# Patient Record
Sex: Male | Born: 1935 | Race: White | Hispanic: No | Marital: Married | Smoking: Never smoker
Health system: Southern US, Community
[De-identification: ages and names within clinical notes are randomized; demographics above are authoritative.]

## PROBLEM LIST (undated history)

## (undated) DIAGNOSIS — M1990 Unspecified osteoarthritis, unspecified site: Secondary | ICD-10-CM

## (undated) DIAGNOSIS — N400 Enlarged prostate without lower urinary tract symptoms: Secondary | ICD-10-CM

## (undated) DIAGNOSIS — D751 Secondary polycythemia: Secondary | ICD-10-CM

## (undated) DIAGNOSIS — R51 Headache: Secondary | ICD-10-CM

## (undated) DIAGNOSIS — K635 Polyp of colon: Secondary | ICD-10-CM

## (undated) DIAGNOSIS — I7100 Dissection of unspecified site of aorta: Secondary | ICD-10-CM

## (undated) DIAGNOSIS — R519 Headache, unspecified: Secondary | ICD-10-CM

## (undated) DIAGNOSIS — I10 Essential (primary) hypertension: Secondary | ICD-10-CM

## (undated) DIAGNOSIS — I442 Atrioventricular block, complete: Secondary | ICD-10-CM

## (undated) DIAGNOSIS — N200 Calculus of kidney: Secondary | ICD-10-CM

## (undated) DIAGNOSIS — C801 Malignant (primary) neoplasm, unspecified: Secondary | ICD-10-CM

## (undated) HISTORY — DX: Headache: R51

## (undated) HISTORY — DX: Hemochromatosis, unspecified: E83.119

## (undated) HISTORY — DX: Benign prostatic hyperplasia without lower urinary tract symptoms: N40.0

## (undated) HISTORY — DX: Headache, unspecified: R51.9

## (undated) HISTORY — DX: Malignant (primary) neoplasm, unspecified: C80.1

## (undated) HISTORY — DX: Atrioventricular block, complete: I44.2

## (undated) HISTORY — DX: Secondary polycythemia: D75.1

## (undated) HISTORY — DX: Polyp of colon: K63.5

## (undated) HISTORY — PX: COLONOSCOPY W/ POLYPECTOMY: SHX1380

## (undated) HISTORY — DX: Calculus of kidney: N20.0

## (undated) HISTORY — DX: Essential (primary) hypertension: I10

## (undated) HISTORY — DX: Dissection of unspecified site of aorta: I71.00

---

## 1998-02-28 ENCOUNTER — Emergency Department (HOSPITAL_COMMUNITY): Admission: EM | Admit: 1998-02-28 | Discharge: 1998-02-28 | Payer: Self-pay | Admitting: Emergency Medicine

## 1998-02-28 ENCOUNTER — Encounter: Payer: Self-pay | Admitting: Emergency Medicine

## 2002-03-03 ENCOUNTER — Encounter: Payer: Self-pay | Admitting: General Surgery

## 2002-03-09 ENCOUNTER — Encounter (INDEPENDENT_AMBULATORY_CARE_PROVIDER_SITE_OTHER): Payer: Self-pay | Admitting: Specialist

## 2002-03-09 ENCOUNTER — Inpatient Hospital Stay (HOSPITAL_COMMUNITY): Admission: RE | Admit: 2002-03-09 | Discharge: 2002-03-17 | Payer: Self-pay | Admitting: General Surgery

## 2002-03-15 ENCOUNTER — Encounter: Payer: Self-pay | Admitting: General Surgery

## 2002-04-06 ENCOUNTER — Encounter: Payer: Self-pay | Admitting: *Deleted

## 2002-04-06 ENCOUNTER — Ambulatory Visit (HOSPITAL_COMMUNITY): Admission: RE | Admit: 2002-04-06 | Discharge: 2002-04-06 | Payer: Self-pay | Admitting: *Deleted

## 2002-09-19 ENCOUNTER — Encounter: Payer: Self-pay | Admitting: *Deleted

## 2002-09-19 ENCOUNTER — Ambulatory Visit (HOSPITAL_COMMUNITY): Admission: RE | Admit: 2002-09-19 | Discharge: 2002-09-19 | Payer: Self-pay | Admitting: *Deleted

## 2003-05-05 HISTORY — PX: COLON SURGERY: SHX602

## 2003-05-05 HISTORY — PX: HERNIA REPAIR: SHX51

## 2003-05-19 ENCOUNTER — Emergency Department (HOSPITAL_COMMUNITY): Admission: EM | Admit: 2003-05-19 | Discharge: 2003-05-19 | Payer: Self-pay | Admitting: Emergency Medicine

## 2003-06-01 ENCOUNTER — Ambulatory Visit (HOSPITAL_COMMUNITY): Admission: RE | Admit: 2003-06-01 | Discharge: 2003-06-01 | Payer: Self-pay | Admitting: General Surgery

## 2003-06-19 ENCOUNTER — Ambulatory Visit (HOSPITAL_COMMUNITY): Admission: RE | Admit: 2003-06-19 | Discharge: 2003-06-19 | Payer: Self-pay | Admitting: Orthopedic Surgery

## 2003-07-10 ENCOUNTER — Ambulatory Visit (HOSPITAL_COMMUNITY): Admission: RE | Admit: 2003-07-10 | Discharge: 2003-07-10 | Payer: Self-pay | Admitting: Neurosurgery

## 2003-08-16 ENCOUNTER — Encounter: Admission: RE | Admit: 2003-08-16 | Discharge: 2003-08-16 | Payer: Self-pay | Admitting: Orthopedic Surgery

## 2003-08-16 ENCOUNTER — Ambulatory Visit (HOSPITAL_COMMUNITY): Admission: RE | Admit: 2003-08-16 | Discharge: 2003-08-16 | Payer: Self-pay | Admitting: Orthopedic Surgery

## 2003-08-16 ENCOUNTER — Ambulatory Visit (HOSPITAL_BASED_OUTPATIENT_CLINIC_OR_DEPARTMENT_OTHER): Admission: RE | Admit: 2003-08-16 | Discharge: 2003-08-16 | Payer: Self-pay | Admitting: Orthopedic Surgery

## 2004-06-06 ENCOUNTER — Ambulatory Visit: Payer: Self-pay | Admitting: Oncology

## 2004-06-13 ENCOUNTER — Ambulatory Visit: Payer: Self-pay | Admitting: Internal Medicine

## 2004-06-16 ENCOUNTER — Ambulatory Visit: Payer: Self-pay | Admitting: Internal Medicine

## 2004-06-18 ENCOUNTER — Ambulatory Visit: Payer: Self-pay | Admitting: Internal Medicine

## 2004-06-18 ENCOUNTER — Ambulatory Visit (HOSPITAL_COMMUNITY): Admission: RE | Admit: 2004-06-18 | Discharge: 2004-06-18 | Payer: Self-pay | Admitting: Oncology

## 2004-06-25 ENCOUNTER — Ambulatory Visit: Payer: Self-pay | Admitting: Internal Medicine

## 2004-08-20 ENCOUNTER — Ambulatory Visit: Payer: Self-pay | Admitting: Internal Medicine

## 2004-09-17 ENCOUNTER — Ambulatory Visit: Payer: Self-pay | Admitting: Internal Medicine

## 2004-10-18 ENCOUNTER — Emergency Department (HOSPITAL_COMMUNITY): Admission: EM | Admit: 2004-10-18 | Discharge: 2004-10-18 | Payer: Self-pay | Admitting: Emergency Medicine

## 2005-01-20 ENCOUNTER — Ambulatory Visit: Payer: Self-pay | Admitting: Internal Medicine

## 2005-01-28 ENCOUNTER — Ambulatory Visit (HOSPITAL_COMMUNITY): Admission: RE | Admit: 2005-01-28 | Discharge: 2005-01-28 | Payer: Self-pay | Admitting: Internal Medicine

## 2005-02-06 ENCOUNTER — Ambulatory Visit: Payer: Self-pay | Admitting: Internal Medicine

## 2005-03-12 ENCOUNTER — Ambulatory Visit: Payer: Self-pay | Admitting: Internal Medicine

## 2005-04-21 ENCOUNTER — Ambulatory Visit: Payer: Self-pay | Admitting: Internal Medicine

## 2005-06-05 ENCOUNTER — Ambulatory Visit: Payer: Self-pay | Admitting: Oncology

## 2005-06-11 ENCOUNTER — Ambulatory Visit (HOSPITAL_COMMUNITY): Admission: RE | Admit: 2005-06-11 | Discharge: 2005-06-11 | Payer: Self-pay | Admitting: Oncology

## 2005-06-11 ENCOUNTER — Ambulatory Visit: Payer: Self-pay | Admitting: Internal Medicine

## 2005-08-25 ENCOUNTER — Ambulatory Visit (HOSPITAL_COMMUNITY): Admission: RE | Admit: 2005-08-25 | Discharge: 2005-08-26 | Payer: Self-pay | Admitting: General Surgery

## 2005-09-06 ENCOUNTER — Ambulatory Visit: Payer: Self-pay | Admitting: Internal Medicine

## 2005-09-06 ENCOUNTER — Inpatient Hospital Stay (HOSPITAL_COMMUNITY): Admission: EM | Admit: 2005-09-06 | Discharge: 2005-09-08 | Payer: Self-pay | Admitting: Emergency Medicine

## 2005-09-16 ENCOUNTER — Ambulatory Visit: Payer: Self-pay | Admitting: Internal Medicine

## 2006-03-18 ENCOUNTER — Ambulatory Visit: Payer: Self-pay | Admitting: Internal Medicine

## 2006-05-27 ENCOUNTER — Ambulatory Visit: Payer: Self-pay | Admitting: Oncology

## 2006-06-01 LAB — COMPREHENSIVE METABOLIC PANEL
ALT: 18 U/L (ref 0–53)
AST: 20 U/L (ref 0–37)
Albumin: 4.4 g/dL (ref 3.5–5.2)
Alkaline Phosphatase: 84 U/L (ref 39–117)
Chloride: 103 mEq/L (ref 96–112)
Potassium: 5.1 mEq/L (ref 3.5–5.3)
Sodium: 142 mEq/L (ref 135–145)
Total Protein: 6.8 g/dL (ref 6.0–8.3)

## 2006-06-01 LAB — CBC WITH DIFFERENTIAL/PLATELET
EOS%: 3.1 % (ref 0.0–7.0)
MCH: 32.9 pg (ref 28.0–33.4)
MCV: 94.3 fL (ref 81.6–98.0)
MONO%: 10.1 % (ref 0.0–13.0)
NEUT#: 3.7 10*3/uL (ref 1.5–6.5)
RBC: 5.49 10*6/uL (ref 4.20–5.71)
RDW: 12.8 % (ref 11.2–14.6)
lymph#: 1.7 10*3/uL (ref 0.9–3.3)

## 2006-06-04 ENCOUNTER — Ambulatory Visit: Payer: Self-pay | Admitting: Oncology

## 2006-06-04 ENCOUNTER — Ambulatory Visit: Payer: Self-pay | Admitting: Internal Medicine

## 2006-06-07 ENCOUNTER — Ambulatory Visit: Payer: Self-pay | Admitting: Internal Medicine

## 2006-06-07 ENCOUNTER — Encounter (INDEPENDENT_AMBULATORY_CARE_PROVIDER_SITE_OTHER): Payer: Self-pay | Admitting: Specialist

## 2006-06-09 LAB — IRON AND TIBC
%SAT: 31 % (ref 20–55)
Iron: 99 ug/dL (ref 42–165)
TIBC: 323 ug/dL (ref 215–435)
UIBC: 224 ug/dL

## 2006-06-09 LAB — VITAMIN B12: Vitamin B-12: 309 pg/mL (ref 211–911)

## 2006-06-09 LAB — FERRITIN: Ferritin: 107 ng/mL (ref 22–322)

## 2006-06-22 ENCOUNTER — Ambulatory Visit (HOSPITAL_COMMUNITY): Admission: RE | Admit: 2006-06-22 | Discharge: 2006-06-22 | Payer: Self-pay | Admitting: Oncology

## 2006-07-05 LAB — ERYTHROPOIETIN: Erythropoietin: 11.6 m[IU]/mL (ref 2.6–34.0)

## 2006-07-05 LAB — FOLATE: Folate: >20 ng/mL

## 2006-07-05 LAB — CBC WITH DIFFERENTIAL (CANCER CENTER ONLY)
BASO#: 0.2 10*3/uL (ref 0.0–0.2)
EOS%: 3.4 % (ref 0.0–7.0)
HGB: 18.7 g/dL — ABNORMAL HIGH (ref 13.0–17.1)
LYMPH#: 2.2 10*3/uL (ref 0.9–3.3)
MCHC: 34.7 g/dL (ref 32.0–35.9)
NEUT#: 3.9 10*3/uL (ref 1.5–6.5)
Platelets: 130 10*3/uL — ABNORMAL LOW (ref 145–400)
RBC: 5.61 10*6/uL (ref 4.20–5.70)

## 2006-07-05 LAB — IRON AND TIBC
%SAT: 58 % — ABNORMAL HIGH (ref 20–55)
TIBC: 342 ug/dL (ref 215–435)

## 2006-07-05 LAB — VITAMIN B12: Vitamin B-12: 259 pg/mL (ref 211–911)

## 2006-07-05 LAB — FERRITIN: Ferritin: 92 ng/mL (ref 22–322)

## 2006-07-14 ENCOUNTER — Encounter (INDEPENDENT_AMBULATORY_CARE_PROVIDER_SITE_OTHER): Payer: Self-pay | Admitting: Interventional Radiology

## 2006-07-14 ENCOUNTER — Ambulatory Visit (HOSPITAL_COMMUNITY): Admission: RE | Admit: 2006-07-14 | Discharge: 2006-07-14 | Payer: Self-pay | Admitting: Oncology

## 2006-08-02 ENCOUNTER — Ambulatory Visit (HOSPITAL_BASED_OUTPATIENT_CLINIC_OR_DEPARTMENT_OTHER): Admission: RE | Admit: 2006-08-02 | Discharge: 2006-08-02 | Payer: Self-pay | Admitting: Internal Medicine

## 2006-08-10 ENCOUNTER — Ambulatory Visit: Payer: Self-pay | Admitting: Pulmonary Disease

## 2006-08-13 ENCOUNTER — Ambulatory Visit: Payer: Self-pay | Admitting: Oncology

## 2006-08-16 LAB — CBC WITH DIFFERENTIAL (CANCER CENTER ONLY)
BASO#: 0.2 10*3/uL (ref 0.0–0.2)
Eosinophils Absolute: 0.3 10*3/uL (ref 0.0–0.5)
HGB: 18.3 g/dL — ABNORMAL HIGH (ref 13.0–17.1)
LYMPH#: 2.2 10*3/uL (ref 0.9–3.3)
MCH: 32.9 pg (ref 28.0–33.4)
MONO#: 0.5 10*3/uL (ref 0.1–0.9)
MONO%: 6.7 % (ref 0.0–13.0)
NEUT#: 4.6 10*3/uL (ref 1.5–6.5)
Platelets: 143 10*3/uL — ABNORMAL LOW (ref 145–400)
RBC: 5.54 10*6/uL (ref 4.20–5.70)
WBC: 7.7 10*3/uL (ref 4.0–10.0)

## 2006-08-23 LAB — CBC WITH DIFFERENTIAL (CANCER CENTER ONLY)
BASO#: 0.1 10*3/uL (ref 0.0–0.2)
Eosinophils Absolute: 0.3 10*3/uL (ref 0.0–0.5)
HCT: 50.6 % — ABNORMAL HIGH (ref 38.7–49.9)
HGB: 17.4 g/dL — ABNORMAL HIGH (ref 13.0–17.1)
LYMPH%: 30.6 % (ref 14.0–48.0)
MCH: 33 pg (ref 28.0–33.4)
MCV: 96 fL (ref 82–98)
MONO#: 0.5 10*3/uL (ref 0.1–0.9)
MONO%: 6 % (ref 0.0–13.0)
NEUT%: 59 % (ref 40.0–80.0)
RBC: 5.27 10*6/uL (ref 4.20–5.70)
WBC: 7.7 10*3/uL (ref 4.0–10.0)

## 2006-08-31 LAB — CBC WITH DIFFERENTIAL (CANCER CENTER ONLY)
BASO%: 0.7 % (ref 0.0–2.0)
HCT: 47.2 % (ref 38.7–49.9)
LYMPH%: 31.3 % (ref 14.0–48.0)
MCH: 33.4 pg (ref 28.0–33.4)
MCV: 96 fL (ref 82–98)
MONO#: 0.5 10*3/uL (ref 0.1–0.9)
MONO%: 7.1 % (ref 0.0–13.0)
NEUT%: 57.6 % (ref 40.0–80.0)
Platelets: 166 10*3/uL (ref 145–400)
RDW: 12 % (ref 10.5–14.6)

## 2006-09-06 LAB — CBC WITH DIFFERENTIAL (CANCER CENTER ONLY)
BASO%: 0.7 % (ref 0.0–2.0)
EOS%: 4 % (ref 0.0–7.0)
HCT: 45.2 % (ref 38.7–49.9)
LYMPH#: 2.3 10*3/uL (ref 0.9–3.3)
LYMPH%: 31.2 % (ref 14.0–48.0)
MCHC: 34.3 g/dL (ref 32.0–35.9)
MCV: 96 fL (ref 82–98)
MONO#: 0.5 10*3/uL (ref 0.1–0.9)
NEUT%: 57.1 % (ref 40.0–80.0)
Platelets: 170 10*3/uL (ref 145–400)
RDW: 11.6 % (ref 10.5–14.6)
WBC: 7.3 10*3/uL (ref 4.0–10.0)

## 2006-09-20 LAB — CBC WITH DIFFERENTIAL (CANCER CENTER ONLY)
BASO#: 0.1 10*3/uL (ref 0.0–0.2)
EOS%: 4.3 % (ref 0.0–7.0)
Eosinophils Absolute: 0.3 10*3/uL (ref 0.0–0.5)
HCT: 45.7 % (ref 38.7–49.9)
HGB: 15.7 g/dL (ref 13.0–17.1)
LYMPH#: 2.2 10*3/uL (ref 0.9–3.3)
MCHC: 34.3 g/dL (ref 32.0–35.9)
MONO#: 0.7 10*3/uL (ref 0.1–0.9)
NEUT#: 4.3 10*3/uL (ref 1.5–6.5)
RBC: 4.86 10*6/uL (ref 4.20–5.70)
WBC: 7.7 10*3/uL (ref 4.0–10.0)

## 2006-09-20 LAB — FERRITIN: Ferritin: 19 ng/mL — ABNORMAL LOW (ref 22–322)

## 2006-10-04 ENCOUNTER — Ambulatory Visit: Payer: Self-pay | Admitting: Oncology

## 2006-10-05 LAB — CBC WITH DIFFERENTIAL (CANCER CENTER ONLY)
BASO#: 0 10*3/uL (ref 0.0–0.2)
Eosinophils Absolute: 0.3 10*3/uL (ref 0.0–0.5)
HCT: 43.6 % (ref 38.7–49.9)
HGB: 15 g/dL (ref 13.0–17.1)
MCH: 31.8 pg (ref 28.0–33.4)
MONO%: 9.2 % (ref 0.0–13.0)
NEUT#: 3.4 10*3/uL (ref 1.5–6.5)
NEUT%: 53.2 % (ref 40.0–80.0)
RBC: 4.72 10*6/uL (ref 4.20–5.70)

## 2006-11-09 LAB — CBC WITH DIFFERENTIAL (CANCER CENTER ONLY)
BASO#: 0.1 10*3/uL (ref 0.0–0.2)
Eosinophils Absolute: 0.3 10*3/uL (ref 0.0–0.5)
HGB: 15.8 g/dL (ref 13.0–17.1)
LYMPH%: 31.7 % (ref 14.0–48.0)
MCH: 30.4 pg (ref 28.0–33.4)
MCV: 91 fL (ref 82–98)
MONO%: 9.6 % (ref 0.0–13.0)
Platelets: 173 10*3/uL (ref 145–400)
RBC: 5.22 10*6/uL (ref 4.20–5.70)

## 2006-11-09 LAB — COMPREHENSIVE METABOLIC PANEL
ALT: 17 U/L (ref 0–53)
AST: 20 U/L (ref 0–37)
Alkaline Phosphatase: 77 U/L (ref 39–117)
Creatinine, Ser: 1.48 mg/dL (ref 0.40–1.50)
Total Bilirubin: 0.6 mg/dL (ref 0.3–1.2)

## 2006-11-09 LAB — IRON AND TIBC
%SAT: 18 % — ABNORMAL LOW (ref 20–55)
TIBC: 372 ug/dL (ref 215–435)

## 2006-11-30 DIAGNOSIS — K4090 Unilateral inguinal hernia, without obstruction or gangrene, not specified as recurrent: Secondary | ICD-10-CM | POA: Insufficient documentation

## 2006-11-30 DIAGNOSIS — N400 Enlarged prostate without lower urinary tract symptoms: Secondary | ICD-10-CM | POA: Insufficient documentation

## 2006-11-30 DIAGNOSIS — I10 Essential (primary) hypertension: Secondary | ICD-10-CM | POA: Insufficient documentation

## 2006-11-30 DIAGNOSIS — Z85038 Personal history of other malignant neoplasm of large intestine: Secondary | ICD-10-CM | POA: Insufficient documentation

## 2007-01-10 ENCOUNTER — Ambulatory Visit: Payer: Self-pay | Admitting: Oncology

## 2007-01-19 LAB — CBC WITH DIFFERENTIAL (CANCER CENTER ONLY)
BASO%: 0.8 % (ref 0.0–2.0)
EOS%: 4 % (ref 0.0–7.0)
LYMPH#: 1.9 10*3/uL (ref 0.9–3.3)
MCHC: 33.9 g/dL (ref 32.0–35.9)
MONO#: 0.5 10*3/uL (ref 0.1–0.9)
NEUT#: 3.9 10*3/uL (ref 1.5–6.5)
Platelets: 136 10*3/uL — ABNORMAL LOW (ref 145–400)
RDW: 14.8 % — ABNORMAL HIGH (ref 10.5–14.6)
WBC: 6.6 10*3/uL (ref 4.0–10.0)

## 2007-01-19 LAB — IRON AND TIBC
Iron: 99 ug/dL (ref 42–165)
TIBC: 369 ug/dL (ref 215–435)
UIBC: 270 ug/dL

## 2007-01-19 LAB — BASIC METABOLIC PANEL - CANCER CENTER ONLY
Chloride: 102 mEq/L (ref 98–108)
Potassium: 4.4 mEq/L (ref 3.3–4.7)

## 2007-01-19 LAB — FERRITIN: Ferritin: 13 ng/mL — ABNORMAL LOW (ref 22–322)

## 2007-01-24 LAB — JAK2 GENOTYPR

## 2007-03-15 ENCOUNTER — Ambulatory Visit: Payer: Self-pay | Admitting: Oncology

## 2007-03-16 LAB — BASIC METABOLIC PANEL - CANCER CENTER ONLY
BUN, Bld: 18 mg/dL (ref 7–22)
CO2: 27 meq/L (ref 18–33)
Calcium: 9.6 mg/dL (ref 8.0–10.3)
Chloride: 101 meq/L (ref 98–108)
Creat: 1.3 mg/dL — ABNORMAL HIGH (ref 0.6–1.2)
Glucose, Bld: 95 mg/dL (ref 73–118)
Potassium: 4.7 meq/L (ref 3.3–4.7)
Sodium: 139 meq/L (ref 128–145)

## 2007-03-16 LAB — CBC WITH DIFFERENTIAL (CANCER CENTER ONLY)
BASO%: 1.1 % (ref 0.0–2.0)
LYMPH#: 2.2 10*3/uL (ref 0.9–3.3)
LYMPH%: 28.7 % (ref 14.0–48.0)
MCV: 91 fL (ref 82–98)
MONO#: 0.7 10*3/uL (ref 0.1–0.9)
Platelets: 152 10*3/uL (ref 145–400)
RDW: 13.6 % (ref 10.5–14.6)
WBC: 7.6 10*3/uL (ref 4.0–10.0)

## 2007-03-16 LAB — IRON AND TIBC: %SAT: 46 % (ref 20–55)

## 2007-03-21 LAB — JAK2 GENOTYPR

## 2007-04-19 LAB — CBC WITH DIFFERENTIAL (CANCER CENTER ONLY)
BASO#: 0.1 10*3/uL (ref 0.0–0.2)
Eosinophils Absolute: 0.2 10*3/uL (ref 0.0–0.5)
HCT: 47.3 % (ref 38.7–49.9)
HGB: 16.2 g/dL (ref 13.0–17.1)
LYMPH#: 2 10*3/uL (ref 0.9–3.3)
MONO#: 0.7 10*3/uL (ref 0.1–0.9)
NEUT#: 3.4 10*3/uL (ref 1.5–6.5)
NEUT%: 53.7 % (ref 40.0–80.0)
RBC: 5.2 10*6/uL (ref 4.20–5.70)

## 2007-04-26 ENCOUNTER — Encounter: Payer: Self-pay | Admitting: Internal Medicine

## 2007-05-05 HISTORY — PX: HAND SURGERY: SHX662

## 2007-05-18 ENCOUNTER — Ambulatory Visit: Payer: Self-pay | Admitting: Oncology

## 2007-05-20 LAB — CBC WITH DIFFERENTIAL (CANCER CENTER ONLY)
BASO%: 1.7 % (ref 0.0–2.0)
LYMPH%: 29.1 % (ref 14.0–48.0)
MCH: 30.4 pg (ref 28.0–33.4)
MCHC: 33.8 g/dL (ref 32.0–35.9)
MCV: 90 fL (ref 82–98)
MONO%: 10.6 % (ref 0.0–13.0)
Platelets: 175 10*3/uL (ref 145–400)
RDW: 11.8 % (ref 10.5–14.6)

## 2007-05-20 LAB — FERRITIN: Ferritin: 11 ng/mL — ABNORMAL LOW (ref 22–322)

## 2007-05-24 ENCOUNTER — Encounter: Payer: Self-pay | Admitting: Internal Medicine

## 2007-08-15 ENCOUNTER — Ambulatory Visit: Payer: Self-pay | Admitting: Oncology

## 2007-08-17 LAB — CBC WITH DIFFERENTIAL (CANCER CENTER ONLY)
BASO#: 0.1 10*3/uL (ref 0.0–0.2)
Eosinophils Absolute: 0.2 10*3/uL (ref 0.0–0.5)
HCT: 46.5 % (ref 38.7–49.9)
HGB: 16 g/dL (ref 13.0–17.1)
LYMPH#: 1.6 10*3/uL (ref 0.9–3.3)
LYMPH%: 28 % (ref 14.0–48.0)
MCV: 86 fL (ref 82–98)
MONO#: 0.5 10*3/uL (ref 0.1–0.9)
NEUT%: 57.6 % (ref 40.0–80.0)
RDW: 14 % (ref 10.5–14.6)
WBC: 5.8 10*3/uL (ref 4.0–10.0)

## 2007-08-17 LAB — IRON AND TIBC
%SAT: 25 % (ref 20–55)
Iron: 84 ug/dL (ref 42–165)
TIBC: 330 ug/dL (ref 215–435)

## 2007-08-17 LAB — COMPREHENSIVE METABOLIC PANEL
ALT: 18 U/L (ref 0–53)
Alkaline Phosphatase: 83 U/L (ref 39–117)
CO2: 26 mEq/L (ref 19–32)
Creatinine, Ser: 1.23 mg/dL (ref 0.40–1.50)
Sodium: 140 mEq/L (ref 135–145)
Total Bilirubin: 0.6 mg/dL (ref 0.3–1.2)
Total Protein: 6.4 g/dL (ref 6.0–8.3)

## 2007-08-17 LAB — CEA: CEA: <0.5 ng/mL (ref 0.0–5.0)

## 2007-08-17 LAB — FERRITIN: Ferritin: 15 ng/mL — ABNORMAL LOW (ref 22–322)

## 2007-08-19 ENCOUNTER — Ambulatory Visit (HOSPITAL_COMMUNITY): Admission: RE | Admit: 2007-08-19 | Discharge: 2007-08-19 | Payer: Self-pay | Admitting: Oncology

## 2007-08-24 ENCOUNTER — Encounter: Payer: Self-pay | Admitting: Internal Medicine

## 2007-09-05 ENCOUNTER — Ambulatory Visit: Payer: Self-pay | Admitting: Internal Medicine

## 2007-09-05 DIAGNOSIS — D751 Secondary polycythemia: Secondary | ICD-10-CM | POA: Insufficient documentation

## 2007-09-21 ENCOUNTER — Ambulatory Visit (HOSPITAL_BASED_OUTPATIENT_CLINIC_OR_DEPARTMENT_OTHER): Admission: RE | Admit: 2007-09-21 | Discharge: 2007-09-21 | Payer: Self-pay | Admitting: Urology

## 2007-12-21 ENCOUNTER — Ambulatory Visit: Payer: Self-pay | Admitting: Oncology

## 2007-12-22 LAB — CBC WITH DIFFERENTIAL (CANCER CENTER ONLY)
BASO%: 0.8 % (ref 0.0–2.0)
Eosinophils Absolute: 0.3 10*3/uL (ref 0.0–0.5)
HCT: 46.6 % (ref 38.7–49.9)
LYMPH#: 2.1 10*3/uL (ref 0.9–3.3)
LYMPH%: 28.4 % (ref 14.0–48.0)
MCV: 90 fL (ref 82–98)
MONO#: 0.6 10*3/uL (ref 0.1–0.9)
Platelets: 148 10*3/uL (ref 145–400)
RBC: 5.16 10*6/uL (ref 4.20–5.70)
RDW: 12.9 % (ref 10.5–14.6)
WBC: 7.5 10*3/uL (ref 4.0–10.0)

## 2007-12-22 LAB — COMPREHENSIVE METABOLIC PANEL
ALT: 20 U/L (ref 0–53)
AST: 25 U/L (ref 0–37)
Albumin: 4 g/dL (ref 3.5–5.2)
Alkaline Phosphatase: 69 U/L (ref 39–117)
CO2: 29 mEq/L (ref 19–32)
Calcium: 9.6 mg/dL (ref 8.4–10.5)
Creatinine, Ser: 1.33 mg/dL (ref 0.40–1.50)
Sodium: 141 mEq/L (ref 135–145)
Total Bilirubin: 0.9 mg/dL (ref 0.3–1.2)
Total Protein: 6.2 g/dL (ref 6.0–8.3)

## 2007-12-22 LAB — FERRITIN: Ferritin: 29 ng/mL (ref 22–322)

## 2007-12-22 LAB — IRON AND TIBC
TIBC: 327 ug/dL (ref 215–435)
UIBC: 205 ug/dL

## 2007-12-27 ENCOUNTER — Encounter: Payer: Self-pay | Admitting: Internal Medicine

## 2008-02-20 ENCOUNTER — Ambulatory Visit: Payer: Self-pay | Admitting: Oncology

## 2008-02-24 LAB — IRON AND TIBC
%SAT: 42 % (ref 20–55)
Iron: 134 ug/dL (ref 42–165)
TIBC: 320 ug/dL (ref 215–435)

## 2008-02-24 LAB — CBC WITH DIFFERENTIAL (CANCER CENTER ONLY)
BASO%: 4.9 % — ABNORMAL HIGH (ref 0.0–2.0)
Eosinophils Absolute: 0.3 10*3/uL (ref 0.0–0.5)
MONO#: 0.5 10*3/uL (ref 0.1–0.9)
NEUT#: 3.3 10*3/uL (ref 1.5–6.5)
Platelets: 125 10*3/uL — ABNORMAL LOW (ref 145–400)
RBC: 5.22 10*6/uL (ref 4.20–5.70)
RDW: 12 % (ref 10.5–14.6)
WBC: 6.5 10*3/uL (ref 4.0–10.0)

## 2008-03-12 ENCOUNTER — Ambulatory Visit: Payer: Self-pay | Admitting: Internal Medicine

## 2008-05-23 ENCOUNTER — Ambulatory Visit: Payer: Self-pay | Admitting: Oncology

## 2008-05-25 LAB — IRON AND TIBC: TIBC: 323 ug/dL (ref 215–435)

## 2008-05-25 LAB — CBC WITH DIFFERENTIAL (CANCER CENTER ONLY)
BASO%: 2.7 % — ABNORMAL HIGH (ref 0.0–2.0)
EOS%: 3.8 % (ref 0.0–7.0)
LYMPH%: 20.2 % (ref 14.0–48.0)
MCH: 31.6 pg (ref 28.0–33.4)
MCHC: 33.7 g/dL (ref 32.0–35.9)
MONO%: 5.9 % (ref 0.0–13.0)
NEUT#: 4.3 10*3/uL (ref 1.5–6.5)
Platelets: 121 10*3/uL — ABNORMAL LOW (ref 145–400)
RBC: 5.68 10*6/uL (ref 4.20–5.70)
RDW: 11.4 % (ref 10.5–14.6)

## 2008-05-28 ENCOUNTER — Encounter: Payer: Self-pay | Admitting: Internal Medicine

## 2008-07-25 ENCOUNTER — Ambulatory Visit: Payer: Self-pay | Admitting: Oncology

## 2008-07-27 LAB — IRON AND TIBC
%SAT: 34 % (ref 20–55)
Iron: 111 ug/dL (ref 42–165)
TIBC: 322 ug/dL (ref 215–435)

## 2008-07-27 LAB — CBC WITH DIFFERENTIAL (CANCER CENTER ONLY)
BASO#: 0.1 10*3/uL (ref 0.0–0.2)
EOS%: 4.4 % (ref 0.0–7.0)
HGB: 16.3 g/dL (ref 13.0–17.1)
LYMPH%: 30.1 % (ref 14.0–48.0)
MCH: 31.9 pg (ref 28.0–33.4)
MCHC: 34.6 g/dL (ref 32.0–35.9)
MCV: 92 fL (ref 82–98)
MONO%: 9.6 % (ref 0.0–13.0)
NEUT#: 3.4 10*3/uL (ref 1.5–6.5)
NEUT%: 54.1 % (ref 40.0–80.0)

## 2008-07-27 LAB — CMP (CANCER CENTER ONLY)
ALT(SGPT): 24 U/L (ref 10–47)
AST: 25 U/L (ref 11–38)
BUN, Bld: 17 mg/dL (ref 7–22)
CO2: 29 mEq/L (ref 18–33)
Creat: 1.4 mg/dl — ABNORMAL HIGH (ref 0.6–1.2)
Total Bilirubin: 1 mg/dl (ref 0.20–1.60)

## 2008-07-27 LAB — FERRITIN: Ferritin: 18 ng/mL — ABNORMAL LOW (ref 22–322)

## 2008-10-24 ENCOUNTER — Ambulatory Visit: Payer: Self-pay | Admitting: Oncology

## 2008-10-26 LAB — CBC WITH DIFFERENTIAL (CANCER CENTER ONLY)
BASO#: 0.1 10*3/uL (ref 0.0–0.2)
EOS%: 3.5 % (ref 0.0–7.0)
HCT: 46.2 % (ref 38.7–49.9)
HGB: 16.2 g/dL (ref 13.0–17.1)
LYMPH#: 1.7 10*3/uL (ref 0.9–3.3)
MCH: 31.6 pg (ref 28.0–33.4)
MCHC: 35.1 g/dL (ref 32.0–35.9)
NEUT%: 59.9 % (ref 40.0–80.0)

## 2008-10-26 LAB — IRON AND TIBC
%SAT: 54 % (ref 20–55)
Iron: 158 ug/dL (ref 42–165)
TIBC: 293 ug/dL (ref 215–435)

## 2008-10-26 LAB — FERRITIN: Ferritin: 32 ng/mL (ref 22–322)

## 2009-02-12 ENCOUNTER — Ambulatory Visit: Payer: Self-pay | Admitting: Oncology

## 2009-02-14 LAB — IRON AND TIBC
%SAT: 37 % (ref 20–55)
Iron: 111 ug/dL (ref 42–165)
TIBC: 302 ug/dL (ref 215–435)
UIBC: 191 ug/dL

## 2009-02-14 LAB — CBC WITH DIFFERENTIAL (CANCER CENTER ONLY)
BASO#: 0.3 10*3/uL — ABNORMAL HIGH (ref 0.0–0.2)
BASO%: 3.7 % — ABNORMAL HIGH (ref 0.0–2.0)
HCT: 50.3 % — ABNORMAL HIGH (ref 38.7–49.9)
HGB: 17.2 g/dL — ABNORMAL HIGH (ref 13.0–17.1)
LYMPH#: 2.1 10*3/uL (ref 0.9–3.3)
MONO#: 0.5 10*3/uL (ref 0.1–0.9)
NEUT%: 55.7 % (ref 40.0–80.0)
RBC: 5.22 10*6/uL (ref 4.20–5.70)
WBC: 7 10*3/uL (ref 4.0–10.0)

## 2009-02-18 ENCOUNTER — Encounter: Payer: Self-pay | Admitting: Internal Medicine

## 2009-05-15 ENCOUNTER — Ambulatory Visit: Payer: Self-pay | Admitting: Oncology

## 2009-05-15 ENCOUNTER — Encounter (INDEPENDENT_AMBULATORY_CARE_PROVIDER_SITE_OTHER): Payer: Self-pay | Admitting: *Deleted

## 2009-05-17 LAB — FERRITIN: Ferritin: 28 ng/mL (ref 22–322)

## 2009-05-17 LAB — CBC WITH DIFFERENTIAL (CANCER CENTER ONLY)
HCT: 49 % (ref 38.7–49.9)
HGB: 16.5 g/dL (ref 13.0–17.1)
LYMPH#: 1.9 10*3/uL (ref 0.9–3.3)
LYMPH%: 23.1 % (ref 14.0–48.0)
MCH: 32.2 pg (ref 28.0–33.4)
MONO%: 7 % (ref 0.0–13.0)
NEUT#: 5.2 10*3/uL (ref 1.5–6.5)
NEUT%: 65 % (ref 40.0–80.0)
RDW: 11.5 % (ref 10.5–14.6)
WBC: 8.1 10*3/uL (ref 4.0–10.0)

## 2009-05-17 LAB — IRON AND TIBC
%SAT: 35 % (ref 20–55)
Iron: 111 ug/dL (ref 42–165)
UIBC: 207 ug/dL

## 2009-05-21 ENCOUNTER — Encounter: Payer: Self-pay | Admitting: Internal Medicine

## 2009-06-04 LAB — HM COLONOSCOPY

## 2009-06-05 ENCOUNTER — Encounter (INDEPENDENT_AMBULATORY_CARE_PROVIDER_SITE_OTHER): Payer: Self-pay | Admitting: *Deleted

## 2009-06-06 ENCOUNTER — Ambulatory Visit: Payer: Self-pay | Admitting: Internal Medicine

## 2009-06-13 ENCOUNTER — Ambulatory Visit: Payer: Self-pay | Admitting: Internal Medicine

## 2009-06-17 ENCOUNTER — Encounter: Payer: Self-pay | Admitting: Internal Medicine

## 2009-08-19 ENCOUNTER — Ambulatory Visit: Payer: Self-pay | Admitting: Oncology

## 2009-08-23 LAB — CBC WITH DIFFERENTIAL (CANCER CENTER ONLY)
EOS%: 4.2 % (ref 0.0–7.0)
HCT: 49.5 % (ref 38.7–49.9)
LYMPH%: 31.3 % (ref 14.0–48.0)
MCH: 31.6 pg (ref 28.0–33.4)
MCHC: 34.2 g/dL (ref 32.0–35.9)
MCV: 93 fL (ref 82–98)
NEUT%: 55.3 % (ref 40.0–80.0)
RBC: 5.35 10*6/uL (ref 4.20–5.70)
WBC: 8.3 10*3/uL (ref 4.0–10.0)

## 2009-08-23 LAB — IRON AND TIBC
%SAT: 30 % (ref 20–55)
Iron: 98 ug/dL (ref 42–165)
TIBC: 326 ug/dL (ref 215–435)
UIBC: 228 ug/dL

## 2009-08-23 LAB — FERRITIN: Ferritin: 33 ng/mL (ref 22–322)

## 2009-08-27 ENCOUNTER — Encounter: Payer: Self-pay | Admitting: Internal Medicine

## 2010-01-30 ENCOUNTER — Ambulatory Visit: Payer: Self-pay | Admitting: Oncology

## 2010-01-31 ENCOUNTER — Encounter: Payer: Self-pay | Admitting: Internal Medicine

## 2010-01-31 LAB — CBC WITH DIFFERENTIAL (CANCER CENTER ONLY)
BASO#: 0.1 10*3/uL (ref 0.0–0.2)
HCT: 47 % (ref 38.7–49.9)
HGB: 16.5 g/dL (ref 13.0–17.1)
LYMPH%: 31.9 % (ref 14.0–48.0)
MCV: 94 fL (ref 82–98)
MONO#: 0.6 10*3/uL (ref 0.1–0.9)
MONO%: 9.2 % (ref 0.0–13.0)
RDW: 11.6 % (ref 10.5–14.6)

## 2010-01-31 LAB — IRON AND TIBC
TIBC: 285 ug/dL (ref 215–435)
UIBC: 150 ug/dL

## 2010-02-17 ENCOUNTER — Ambulatory Visit: Payer: Self-pay | Admitting: Diagnostic Radiology

## 2010-02-17 ENCOUNTER — Emergency Department (HOSPITAL_BASED_OUTPATIENT_CLINIC_OR_DEPARTMENT_OTHER): Admission: EM | Admit: 2010-02-17 | Discharge: 2010-02-17 | Payer: Self-pay | Admitting: Emergency Medicine

## 2010-06-03 NOTE — Letter (Signed)
Summary: Colonoscopy Letter  [facility]  [address] [place], [place] [address]   Phone: [phone]  Fax: [phone]      May 15, 2009 MRN: [id number]   Brandon Hensley [address] [place], [place]  [address]   Dear Mr. Puccini,   According to your medical record, it is time for you to schedule a Colonoscopy. The American Cancer Society recommends this procedure as a method to detect early colon cancer. Patients with a family history of colon cancer, or a personal history of colon polyps or inflammatory bowel disease are at increased risk.  This letter has beeen generated based on the recommendations made at the time of your procedure. If you feel that in your particular situation this may no longer apply, please contact our office.  Please call our office at [phone] to schedule this appointment or to update your records at your earliest convenience.  Thank you for cooperating with [place] to provide you with the very best care possible.   Sincerely,  Iva Boop, M.D.  [facility] Gastroenterology Division [phone]

## 2010-06-03 NOTE — Procedures (Signed)
Summary: Colonoscopy  Patient: [name] Note: All result statuses are Final unless otherwise noted.  Tests: (1) Colonoscopy (COL)   COL Colonoscopy           DONE     [facility]     [address]     [place], [place]  [address]           COLONOSCOPY PROCEDURE REPORT           PATIENT:  Brandon Hensley, Brandon Hensley  MR#:  [id number]     BIRTHDATE:  [birth year], 82 yrs. old  GENDER:  male           ENDOSCOPIST:  Iva Boop, MD, [facility]           PROCEDURE DATE:  06/13/2009     PROCEDURE:  Colonoscopy with snare polypectomy     ASA CLASS:  Class I     INDICATIONS:  screening, surveillance prior colon cancer T2 N0M0     s/p sigmoid resection 11/03, prior surveillance colonoscopy 2004,     2006, 2008 with at least 1 diminutive adenoma each time           MEDICATIONS:   Fentanyl 50 mcg IV, Versed 5 mg IV           DESCRIPTION OF PROCEDURE:   After the risks benefits and     alternatives of the procedure were thoroughly explained, informed     consent was obtained.  Digital rectal exam was performed and     revealed an enlarged prostate. Prostate mildly enlarged without     nodules - likely age-appropriate finding. The LB CF-H180AL [id number]     endoscope was introduced through the anus and advanced to the     cecum, which was identified by both the appendix and ileocecal     valve, without limitations.  The quality of the prep was     excellent, using MoviPrep.  The instrument was then slowly     withdrawn as the colon was fully examined. Insertion: 1:48 minutes     withdrawal: 9:26 minutes     <<PROCEDUREIMAGES>>           FINDINGS:  Two polyps were found. Ascending and splenic flexure, 6     mm and 3 mm, respectively. Polyps were snared without cautery.     Retrieval was successful (see image6 and image7). snare polyp     Mild diverticulosis was found in the left colon.  There was a     surgical anastomosis. Prior segmental resection in sigmoid with     normal  anastamosis.  This was otherwise a normal examination of     the colon.   Retroflexed views in the rectum revealed no     abnormalities.    The scope was then withdrawn from the patient     and the procedure completed.           COMPLICATIONS:  None           ENDOSCOPIC IMPRESSION:     1) Two small polyps removed     2) Mild diverticulosis in the left colon     3) Otherwise normal, excellent prep     4) Prior segmental coloectomy (for colon cancer 2003)     5) Prior adenomatous polyps           REPEAT EXAM:  In for Colonoscopy, pending biopsy results. he     remins vigorous and appears younger than stated  age           Iva Boop, MD, [facility]           CC:  [name], MD     Claud Kelp, MD     Linda Hedges. Plotnikov, MD     The Patient           n.     eSIGNED:   Iva Boop at 06/13/2009 11:34 AM           Ileana Ladd, [id number]  Note: An exclamation mark (!) indicates a result that was not dispersed into the flowsheet. Document Creation Date: 06/13/2009 11:34 AM _______________________________________________________________________  (1) Order result status: Final Collection or observation date-time: 06/13/2009 11:20 Requested date-time:  Receipt date-time:  Reported date-time:  Referring Physician:   Ordering Physician: Stan Head [phone]) Specimen Source:  Source: Launa Grill Order Number: [id number] Lab site:   Appended Document: Colonoscopy     Procedures Next Due Date:    Colonoscopy: 06/2014

## 2010-06-03 NOTE — Letter (Signed)
[facility]   Imported By: Lester Ephesus 06/11/2009 10:05:12  _____________________________________________________________________  External Attachment:    Type:   Image     Comment:   External Document

## 2010-06-03 NOTE — Letter (Signed)
Summary: [facility]  [facility]   Imported By: Lester [place] 03/04/2010 10:14:26  _____________________________________________________________________  External Attachment:    Type:   Image     Comment:   External Document

## 2010-06-03 NOTE — Letter (Signed)
[facility]   Imported By: Lennie Odor 10/08/2009 14:27:02  _____________________________________________________________________  External Attachment:    Type:   Image     Comment:   External Document

## 2010-06-03 NOTE — Miscellaneous (Signed)
Summary: Moviprep Rx  Clinical Lists Changes  Medications: Added new medication of MOVIPREP 100 GM  SOLR (PEG-KCL-NACL-NASULF-NA ASC-C) As per prep instructions. - Signed Rx of MOVIPREP 100 GM  SOLR (PEG-KCL-NACL-NASULF-NA ASC-C) As per prep instructions.;  #1 x 0;  Signed;  Entered by: Jennye Boroughs RN;  Authorized by: Iva Boop MD, [facility];  Method used: Electronically to [facility]. [id number]*, [address] [place], [place], [place]  [address], Ph: [phone] or [phone], Fax: [phone]    Prescriptions: MOVIPREP 100 GM  SOLR (PEG-KCL-NACL-NASULF-NA ASC-C) As per prep instructions.  #1 x 0   Entered by:   Jennye Boroughs RN   Authorized by:   Iva Boop MD, [facility]   Signed by:   Jennye Boroughs RN on 06/06/2009   Method used:   Electronically to        [facility]. [phone]* (retail)       [address] [place], [place]  [address]       Ph: [phone] or [phone]       Fax: [phone]   RxID:   [id number]

## 2010-06-03 NOTE — Letter (Signed)
Summary: Patient Notice- Polyp Results  [facility]  [address] [place], [place] [address]   Phone: [phone]  Fax: [phone]        June 17, 2009 MRN: [id number]    ALIM CATTELL [address] [place], [place]  [address]    Dear Mr. Harlacher,  I am pleased to inform you that the colon polyp(s) removed during your recent colonoscopy was (were) found to be benign (no cancer detected) upon pathologic examination.  I recommend you have a repeat colonoscopy examination in 5 years to look for recurrent polyps, as having colon polyps increases your risk for having recurrent polyps or even colon cancer in the future.  Should you develop new or worsening symptoms of abdominal pain, bowel habit changes or bleeding from the rectum or bowels, please schedule an evaluation with either your primary care physician or with me.  Please call us if you are having persistent problems or have questions about your condition that have not been fully answered at this time.  Sincerely,  Iva Boop MD, [facility]  This letter has been electronically signed by your physician.  Appended Document: Patient Notice- Polyp Results Letter mailed 2.15.11

## 2010-06-03 NOTE — Letter (Signed)
Summary: [facility] Instructions  [facility]  [address] [place], [place] [address]   Phone: [phone]  Fax: [phone]       Brandon Hensley    01-Feb-1927    MRN: [id number]        Procedure Day Dorna Bloom:  Lenor Coffin  06/13/09     Arrival Time:  9:30AM     Procedure Time:  10:30AM     Location of Procedure:                    Juliann Pares _  [facility] (4th Floor)                        PREPARATION FOR COLONOSCOPY WITH MOVIPREP   Starting 5 days prior to your procedure 06/08/09 do not eat nuts, seeds, popcorn, corn, beans, peas,  salads, or any raw vegetables.  Do not take any fiber supplements (e.g. Metamucil, Citrucel, and Benefiber).  THE DAY BEFORE YOUR PROCEDURE         DATE: 06/12/09  DAY:  WEDNESDAY  1.  Drink clear liquids the entire day-NO SOLID FOOD  2.  Do not drink anything colored red or purple.  Avoid juices with pulp.  No orange juice.  3.  Drink at least 64 oz. (8 glasses) of fluid/clear liquids during the day to prevent dehydration and help the prep work efficiently.  CLEAR LIQUIDS INCLUDE: Water Jello Ice Popsicles Tea (sugar ok, no milk/cream) Powdered fruit flavored drinks Coffee (sugar ok, no milk/cream) Gatorade Juice: apple, white grape, white cranberry  Lemonade Clear bullion, consomm, broth Carbonated beverages (any kind) Strained chicken noodle soup Hard Candy                             4.  In the morning, mix first dose of MoviPrep solution:    Empty 1 Pouch A and 1 Pouch B into the disposable container    Add lukewarm drinking water to the top line of the container. Mix to dissolve    Refrigerate (mixed solution should be used within 24 hrs)  5.  Begin drinking the prep at 5:00 p.m. The MoviPrep container is divided by 4 marks.   Every 15 minutes drink the solution down to the next mark (approximately 8 oz) until the full liter is complete.   6.  Follow completed prep with 16 oz of clear liquid of your choice  (Nothing red or purple).  Continue to drink clear liquids until bedtime.  7.  Before going to bed, mix second dose of MoviPrep solution:    Empty 1 Pouch A and 1 Pouch B into the disposable container    Add lukewarm drinking water to the top line of the container. Mix to dissolve    Refrigerate  THE DAY OF YOUR PROCEDURE      DATE: 06/13/09 DAY: THURSDAY  Beginning at 5:30AM (5 hours before procedure):         1. Every 15 minutes, drink the solution down to the next mark (approx 8 oz) until the full liter is complete.  2. Follow completed prep with 16 oz. of clear liquid of your choice.    3. You may drink clear liquids until 8:30AM (2 HOURS BEFORE PROCEDURE).   MEDICATION INSTRUCTIONS  Unless otherwise instructed, you should take regular prescription medications with a small sip of water   as early as possible the morning  of your procedure.           OTHER INSTRUCTIONS  You will need a responsible adult at least 75 years of age to accompany you and drive you home.   This person must remain in the waiting room during your procedure.  Wear loose fitting clothing that is easily removed.  Leave jewelry and other valuables at home.  However, you may wish to bring a book to read or  an iPod/MP3 player to listen to music as you wait for your procedure to start.  Remove all body piercing jewelry and leave at home.  Total time from sign-in until discharge is approximately 2-3 hours.  You should go home directly after your procedure and rest.  You can resume normal activities the  day after your procedure.    The day of your procedure you should not:   Drive   Make legal decisions   Operate machinery   Drink alcohol   Return to work  You will receive specific instructions about eating, activities and medications before you leave.    The above instructions have been reviewed and explained to me by   Jennye Boroughs RN  June 06, 2009 11:23 AM    I fully  understand and can verbalize these instructions _____________________________ Date _________

## 2010-07-16 LAB — URINALYSIS, ROUTINE W REFLEX MICROSCOPIC
Bilirubin Urine: NEGATIVE
Glucose, UA: NEGATIVE mg/dL
Ketones, ur: 15 mg/dL — AB
Nitrite: NEGATIVE

## 2010-07-16 LAB — DIFFERENTIAL
Basophils Absolute: 0.1 10*3/uL (ref 0.0–0.1)
Lymphocytes Relative: 12 % (ref 12–46)
Monocytes Relative: 10 % (ref 3–12)
Neutrophils Relative %: 76 % (ref 43–77)

## 2010-07-16 LAB — POCT CARDIAC MARKERS
CKMB, poc: 2 ng/mL (ref 1.0–8.0)
Troponin i, poc: <0.05 ng/mL (ref 0.00–0.09)

## 2010-07-16 LAB — COMPREHENSIVE METABOLIC PANEL
ALT: 23 U/L (ref 0–53)
AST: 37 U/L (ref 0–37)
Chloride: 104 mEq/L (ref 96–112)
GFR calc Af Amer: >60 mL/min (ref >60)
GFR calc non Af Amer: 53 mL/min — ABNORMAL LOW (ref >60)
Sodium: 142 mEq/L (ref 135–145)

## 2010-07-16 LAB — CBC
Hemoglobin: 17.4 g/dL — ABNORMAL HIGH (ref 13.0–17.0)
MCH: 33.3 pg (ref 26.0–34.0)
MCHC: 34.7 g/dL (ref 30.0–36.0)
MCV: 95.8 fL (ref 78.0–100.0)
RBC: 5.23 MIL/uL (ref 4.22–5.81)
RDW: 12.3 % (ref 11.5–15.5)

## 2010-08-05 ENCOUNTER — Other Ambulatory Visit: Payer: Self-pay | Admitting: Physician Assistant

## 2010-08-05 ENCOUNTER — Encounter (HOSPITAL_BASED_OUTPATIENT_CLINIC_OR_DEPARTMENT_OTHER): Payer: Medicare Other | Admitting: Oncology

## 2010-08-05 DIAGNOSIS — D649 Anemia, unspecified: Secondary | ICD-10-CM

## 2010-08-05 DIAGNOSIS — D751 Secondary polycythemia: Secondary | ICD-10-CM

## 2010-08-05 DIAGNOSIS — C189 Malignant neoplasm of colon, unspecified: Secondary | ICD-10-CM

## 2010-08-05 DIAGNOSIS — E669 Obesity, unspecified: Secondary | ICD-10-CM

## 2010-08-05 LAB — CBC WITH DIFFERENTIAL/PLATELET
BASO%: 0.4 % (ref 0.0–2.0)
Basophils Absolute: 0 10*3/uL (ref 0.0–0.1)
EOS%: 3.2 % (ref 0.0–7.0)
MCHC: 34.3 g/dL (ref 32.0–36.0)
MCV: 94.8 fL (ref 79.3–98.0)
NEUT%: 59.2 % (ref 39.0–75.0)
Platelets: 128 10*3/uL — ABNORMAL LOW (ref 140–400)
RDW: 13.5 % (ref 11.0–14.6)
WBC: 6.6 10*3/uL (ref 4.0–10.3)

## 2010-08-05 LAB — LACTATE DEHYDROGENASE: LDH: 146 U/L (ref 94–250)

## 2010-09-16 NOTE — Op Note (Signed)
NAME:  LANTZ, HERMANN NO.:  [id number]   MEDICAL RECORD NO.:  [id number]           PATIENT TYPE:   LOCATION:                                 FACILITY:   PHYSICIAN:  Maretta Bees. Vonita Moss, M.D.DATE OF BIRTH:  Dec 17, 1926   DATE OF PROCEDURE:  09/21/2007  DATE OF DISCHARGE:                               OPERATIVE REPORT   [facility]:   PREOPERATIVE DIAGNOSES:  Recurrent balanitis.   POSTOPERATIVE DIAGNOSES:  Recurrent balanitis.   PROCEDURE:  Circumcision.   SURGEON:  Maretta Bees. Vonita Moss, M.D.   ANESTHESIA:  General.   INDICATIONS:  This 77 year old gentleman has had a long history of  recurrent penile irritation and he elects to have circumcision at this  time.  He was advised about postoperative bleeding, infection, etc.   PROCEDURE:  The patient was placed in supine position.  The external  genitalia were prepped and draped in the usual fashion.  A circumcision  was performed using the sleeve technique.  Hemostasis was obtained by  use of electrocautery.  The frenular area was closed with running 4-0  chromic catgut and 4-0 chromic catgut was placed between the distal edge  of the penile skin and residual edge of mucosal foreskin at 3, 6, 9 and  12 o'clock.  Running 4-0 chromic catgut was placed between these  quadrant sutures and, just prior to that, the penis was infiltrated with  0.25% Marcaine for postoperative analgesia.  The wound was then dressed  with Vaseline gauze, dry sterile gauze and a Coban dressing.  He was  taken to the recovery room in good condition with no significant blood-  loss, having tolerated the procedure well.      Maretta Bees. Vonita Moss, M.D.  Electronically Signed     LJP/MEDQ  D:  09/21/2007  T:  09/21/2007  Job:  [id number]

## 2010-09-19 NOTE — Procedures (Signed)
NAME:  Brandon Hensley, Brandon Hensley NO.:  [id number]   MEDICAL RECORD NO.:  [id number]          PATIENT TYPE:  OUT   LOCATION:  SLEEP CENTER                 FACILITY:  [facility]   PHYSICIAN:  Barbaraann Share, MD,FCCPDATE OF BIRTH:  Sep 08[birth year] 1928   DATE OF STUDY:                            NOCTURNAL POLYSOMNOGRAM   REFERRING PHYSICIAN:   INDICATION FOR STUDY:  Hypersomnia with sleep apnea.   EPWORTH SLEEPINESS SCORE:  6.   SLEEP ARCHITECTURE:  The patient had a total sleep time of 185 minutes  with very little REM and never achieved slow-wave sleep.  Sleep onset  latency was normal at 19 minutes, and REM onset was prolonged at 153  minutes.  Sleep efficiency was extremely poor at 46%.   RESPIRATORY DATA:  The patient was found to have 38 hypopneas and 12  apneas for an apnea/hypopnea index of 16 events per hour.  The events  occurred in all body position but were clearly worse during REM.  Moderate snoring was noted throughout.   OXYGEN DATA:  There was O2 desaturation as low as 88% with the patient's  obstructive events.   CARDIAC DATA:  No clinically significant cardiac arrhythmias were noted.   MOVEMENT-PARASOMNIA:  No clinically significant leg jerks or abnormal  behaviors were noted.   IMPRESSION/RECOMMENDATION:  Mild obstructive sleep apnea/hypopnea  syndrome with an apnea/hypopnea index of 16 events per hour, and oxygen  desaturation as low as 88%.  The patient had a very low total sleep time  as well as very little rapid eye movement and slow-wave sleep,  therefore, the degree of sleep apnea may be significantly  underestimated.  Clinical correlation is suggested.  Treatment for this  degree of sleep apnea may include weight loss alone if applicable, upper  airway surgery, oral appliance, and also continuous positive airway  pressure.      Barbaraann Share, MD,FCCP  Diplomate, American Board of Sleep  Medicine  Electronically Signed    KMC/MEDQ  D:   08/10/2006 16:06:25  T:  08/10/2006 21:30:54  Job:  [id number]

## 2010-09-19 NOTE — H&P (Signed)
NAME:  Brandon Hensley, RICHES NO.:  [id number]   MEDICAL RECORD NO.:  [id number]          PATIENT TYPE:  INP   LOCATION:  1610                         FACILITY:  [facility]   PHYSICIAN:  Wanda Plump, MD [facility]    DATE OF BIRTH:  [birth year]   DATE OF ADMISSION:  09/06/2005  DATE OF DISCHARGE:                                HISTORY & PHYSICAL   CHIEF COMPLAINT:  Nausea.   HISTORY OF PRESENT ILLNESS:  [name] is a 77 year old white male who got  sick 48 hours ago.  He started with severe nausea and now having only dry  heaves.  He also developed watery, nonbloody diarrhea.  In the ER, he was  found to be tachycardic, and there was a question about ileus on the x-ray,  so he was admitted for further care.   PAST MEDICAL HISTORY:  1.  History of high blood pressure.  2.  History of colon cancer, status post sigmoid colon resection in      November, 2003.  3.  No history of radiation or chemotherapy.  4.  Remote right hernia repair.  5.  Recent hernia repair of the spigelian type on the right side.  This was      on August 28, 2005 by Dr. Derrell Lolling.  6.  Status post decompression of the ulnar nerve.  7.  History of postoperative urinary retention.   FAMILY HISTORY:  Noncontributory.   SOCIAL HISTORY:  Does not smoke or drink.   REVIEW OF SYSTEMS:  He is dizzy when he stands up.  He denies any  hematemesis or melena.  He has not taken any recent antibiotics by mouth.  Denies any abdominal pain.  He did have a temperature of 100.5 yesterday.  No chest pain or heartburn.   MEDICATIONS:  1.  Atenolol 1/2 pill a day.  Apparently is 25 mg, but the patient is not      sure.  2.  Flomax q.o.d.   ALLERGIES:  No known drug allergies.   PHYSICAL EXAMINATION:  VITAL SIGNS:  He is afebrile.  Pulse 107, blood  pressure 162/92, repeat blood pressure 121/90, O2 sat 98% on room air.  GENERAL:  The patient is alert and oriented.  HEENT:  Not pale or icteric.  LUNGS:  Clear to  auscultation bilaterally.  CARDIOVASCULAR:  Regular rate and rhythm.  ABDOMEN:  Not distended.  Soft.  There is no mass.  Bowel sounds are  positive.  He has well-healed incisions from previous colon cancer resection  and remote hernia.  The most recent incision is covered by Steri-Strips.  On  palpation, there is no tenderness or fluid collection.  There is no redness  or discharge noticed.  RECTAL:  This was performed by the emergency physician.  Hemoccult was  negative.  There was no impaction.  EXTREMITIES:  No edema.   LABORATORY/X-RAYS:  Preoperative blood work done in April showed a white  count of 7.2 with a hemoglobin of 17 and platelets of 151.  Electrolytes  were normal.  Today, the potassium is 4, creatinine 1.3, which is stable.  Blood sugar is 124.  The rest of the labs, including CBC as well as amylase  and lipase are pending.   Abdominal x-ray:  The report is pending; however, he has some distended  colonic loops without clear-cut air/fluid levels.   ASSESSMENT/PLAN:  The patient is admitted with nausea, vomiting, diarrhea.  This is most likely due to gastroenteritis rather than a complication from  surgery.  He will be admitted for IV fluids and supportive care.  We will  call the surgical service in the morning to let him know that the patient is  in the hospital.      Wanda Plump, MD [facility]  Electronically Signed     JEP/MEDQ  D:  09/06/2005  T:  09/06/2005  Job:  [id number]

## 2010-09-19 NOTE — Consult Note (Signed)
NAME:  Brandon Hensley, Brandon Hensley                       ACCOUNT NO.:  [id number]   MEDICAL RECORD NO.:  [id number]                   PATIENT TYPE:  INP   LOCATION:  0379                                 FACILITY:  [facility]   PHYSICIAN:  Enzo Montgomery. Filbert Berthold, M.D.               DATE OF BIRTH:  1926-08-01   DATE OF CONSULTATION:  DATE OF DISCHARGE:                                   CONSULTATION   IDENTIFYING DATA:  A 77 year old gentleman with a recent diagnosis of  sigmoid colon cancer.   HISTORY OF PRESENT ILLNESS:  The patient is an amazingly active gentleman  who presented with crampy abdominal pain in the beginning of October and had  bloody diarrhea which led to a colonoscopy.  The colonoscopy was done on  February 17, 2002.  It was notable for a small tubular adenoma in the cecum,  hyperplastic polyp at 18 cm, and a colon cancer 22-24 cm.  On November 6th,  Haywood M. Derrell Lolling, M.D. took him to the operating room and removed a 1 cm  moderate to poorly differentiated adenocarcinoma with margins widely  negative, no evidence of perforation, but invaded into the muscularis  propria but not through it, 0 of 12 lymph nodes were positive and thus a T2  N0 colon cancer.  A CEA was less than 0.5.   He is recovering from surgery now and is feeling well except he has ongoing  postop ileus.  Prior to his presentation for the colonoscopy, he had no  symptoms whatsoever except for an intentional 10-pound weight loss.  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   PAST MEDICAL HISTORY:  None.   MEDICATIONS:  None prior to admission.   ALLERGIES:  No known drug allergies.   HABITS:  No tobacco ever, very rare alcohol.   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  He is active in [employer].  He is not married.  He is  divorced.  He has one  grown child.  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   FAMILY HISTORY:  No cancers.   PHYSICAL EXAMINATION:  GENERAL:  Much younger than his stated age, very fit-  appearing.  HEENT:  No oral lesions.  LYMPHATICS:  No palpable lymph nodes.  LUNGS:  Clear to auscultation.  No vertebral tenderness.  HEART:  Normal S1 & S2.  ABDOMEN:  Postsurgical and healing.  EXTREMITIES:  He has no peripheral edema.   LABORATORY DATA:  Pathology and labs as mentioned in the HPI.   IMPRESSION:  A very fit and active 77 year old gentleman with a T2 N0 stage  II colon cancer status post a thorough resection.  I had  a long discussion  with him regarding the potential benefits of chemotherapy adjuvantly in his  setting.  I suspect that he has a very low likelihood of this returning but  given his fitness and long life expectancy should it recur it would likely  be his cause of death.  Given that, I think it is reasonable to offer  chemotherapy especially since [facility] regimen 5-FU is very well  tolerated.  He would like to think about these things and he will follow up  in the office once he has recovered from his surgery.   PLAN:  1. I will arrange for a followup in the next 3-4 weeks.  2. Consider adjuvant [place] 5-FU leucovorin six weeks on, two weeks     off to be given x3 cycles over six months.  3. I expect this would reduce his chance of occurrence by approximately 30%,     and believe that his chance of recurrence at this time is on the order of     15-20%.  4. He will need a colonoscopy within the next year.                                               Robert C. Filbert Berthold, M.D.    RCW/MEDQ  D:  03/14/2002  T:  03/14/2002  Job:  [id number]   cc:   Angelia Mould. Derrell Lolling, M.D.  [address], [address]  [place]  [place] [address]  Fax: [phone]   Sigmund I. Patsi Sears, M.D.

## 2010-09-19 NOTE — Discharge Summary (Signed)
NAMESERIGNE, KUBICEK NO.:  [id number]   MEDICAL RECORD NO.:  [id number]          PATIENT TYPE:  INP   LOCATION:  1610                         FACILITY:  [facility]   PHYSICIAN:  Rene Paci, M.D. LHCDATE OF BIRTH:  09/18/1926   DATE OF ADMISSION:  09/06/2005  DATE OF DISCHARGE:  09/08/2005                                 DISCHARGE SUMMARY   DISCHARGE DIAGNOSES:  1.  Nausea, vomiting, diarrhea secondary to infectious gastroenteritis,      symptoms resolved.  Tolerating regular diet.  2.  Mild dehydration secondary above with azotemia, resolved status post IV      hydration. Hemodynamically stable.  3.  History of hypertension, stable on atenolol.  4.  History of benign prostatic hypertrophy, asymptomatic:  Continue on      Flomax.  5.  Headache secondary to cervical degenerative disk disease, chronic prior      to admission.  Continue Tramadol p.r.n.  6.  Status post right-sided hernia repair August 28, 2005 by Dr. Derrell Lolling.  No      surgical complications or ileus.   DISCHARGE MEDICATIONS:  As prior to admission and include atenolol 50 mg  tablet 1/2 tablet p.o. q.a.m. and Flomax 0.4 mg p.o. daily.  Also takes  tramadol 50 mg 1-2 p.o. q.6h. p.r.n.   DISPOSITION:  The patient is discharged home in medically stable condition,  tolerating solid p.o. diet without adverse symptoms.  Hospital follow up is  with his primary care physician, Dr. Georgina Quint. Plotnikov, for Wednesday,  May 16th at 1:30 p.m.   CONDITION ON DISCHARGE:  Medically stable.   HOSPITAL COURSE:  Problem 1:  NAUSEA, VOMITING, DIARRHEA:  The patient is a  pleasant 77 year old gentleman who was less than two weeks out from his  hernia repair, who two days prior to admission had developing of abdominal  pain, cramping associated with nausea, vomiting and then diarrhea.  Because  of persisting GI loss symptoms and concern for dehydration, he called a  friend who brought him to the emergency room for  evaluation.  There he was  found to be mildly dehydrated but no evidence of small bowel obstruction,  fever or leukocytosis.  It was felt to be secondary to infectious  gastroenteritis and he was admitted for IV hydration and mild dehydration  associated with this.  Surgery was notified of the patient's admission and  Dr. Derrell Lolling agreed that this was likely infectious and not related to recent  surgery.  No evidence of ileus or small-bowel obstruction.  The patient's  symptoms quickly improved with symptomatic support, IV hydration.  He  tolerated clear liquid diet well and was advanced to a regular diet 24 hours  prior to discharge, which he has also tolerated well.  No recurrence of  adverse symptoms and felt stable for discharge home.  Remained  hemodynamically stable.  C. diff was negative.  LFTs were normal.  No other  abnormality identified.   Problem 2:  OTHER MEDICAL ISSUES:  As listed above and no changes were made  to medical regimen as prior to admission.  Rene Paci, M.D. [facility]  Electronically Signed     VL/MEDQ  D:  09/08/2005  T:  09/09/2005  Job:  [id number]

## 2011-01-28 LAB — POCT I-STAT 4, (NA,K, GLUC, HGB,HCT)
Glucose, Bld: 100 — ABNORMAL HIGH
HCT: 51
Hemoglobin: 17.3 — ABNORMAL HIGH
Potassium: 4.4
Sodium: 142

## 2011-02-21 ENCOUNTER — Other Ambulatory Visit: Payer: Self-pay | Admitting: Oncology

## 2011-02-21 DIAGNOSIS — D751 Secondary polycythemia: Secondary | ICD-10-CM

## 2011-02-21 DIAGNOSIS — D750 Familial erythrocytosis: Secondary | ICD-10-CM

## 2011-03-11 ENCOUNTER — Other Ambulatory Visit (HOSPITAL_BASED_OUTPATIENT_CLINIC_OR_DEPARTMENT_OTHER): Payer: Medicare Other | Admitting: Lab

## 2011-03-11 ENCOUNTER — Telehealth: Payer: Self-pay | Admitting: *Deleted

## 2011-03-11 ENCOUNTER — Ambulatory Visit (HOSPITAL_BASED_OUTPATIENT_CLINIC_OR_DEPARTMENT_OTHER): Payer: Medicare Other | Admitting: Oncology

## 2011-03-11 ENCOUNTER — Other Ambulatory Visit: Payer: Self-pay | Admitting: Oncology

## 2011-03-11 DIAGNOSIS — G4733 Obstructive sleep apnea (adult) (pediatric): Secondary | ICD-10-CM

## 2011-03-11 DIAGNOSIS — D751 Secondary polycythemia: Secondary | ICD-10-CM

## 2011-03-11 DIAGNOSIS — D508 Other iron deficiency anemias: Secondary | ICD-10-CM

## 2011-03-11 DIAGNOSIS — R5383 Other fatigue: Secondary | ICD-10-CM

## 2011-03-11 DIAGNOSIS — R5381 Other malaise: Secondary | ICD-10-CM

## 2011-03-11 LAB — CBC & DIFF AND RETIC
BASO%: 0.5 % (ref 0.0–2.0)
HCT: 47.9 % (ref 38.4–49.9)
LYMPH%: 26 % (ref 14.0–49.0)
MCH: 32.2 pg (ref 27.2–33.4)
MCHC: 34.9 g/dL (ref 32.0–36.0)
MCV: 92.5 fL (ref 79.3–98.0)
MONO#: 0.6 10*3/uL (ref 0.1–0.9)
MONO%: 8.5 % (ref 0.0–14.0)
NEUT%: 57.7 % (ref 39.0–75.0)
Platelets: 140 10*3/uL (ref 140–400)
WBC: 6.5 10*3/uL (ref 4.0–10.3)

## 2011-03-11 LAB — IRON AND TIBC
%SAT: 32 % (ref 20–55)
Iron: 85 ug/dL (ref 42–165)

## 2011-03-11 NOTE — Telephone Encounter (Signed)
GAVE PATIENT APPOINTMENT FOR 06-2011 

## 2011-03-12 ENCOUNTER — Telehealth: Payer: Self-pay | Admitting: Oncology

## 2011-03-12 NOTE — Progress Notes (Signed)
Hematology and Oncology Follow Up Visit  Brandon Hensley [id number] Sep 08, 1926 77 y.o. 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  Encounter Diagnoses  Name Primary?  . Other specified iron deficiency anemias   . Polycythemia, secondary      PAST THERAPY: She has had multiple phlebotomies in the past also has a history of colon cancer which was stage I diagnosed originally in November 2003 he is N ED.   Interim History:  Patient is seen in followup today. Overall he seems to be doing well. He is however complaining of some fatigue. He feels that his probably does need a phlebotomy. Has not had any headaches double vision blurring of vision he has no fevers chills or night sweats. He has not developed any nausea or vomiting or abdominal pain no peripheral paresthesias. Remainder of the template review of systems is negative.  Medications: I have reviewed the patient's current medications.  Allergies: No known drug allergies.  Past Medical History, Surgical history, Social history, and Family History were reviewed and updated.  Review of Systems: Constitutional:  Negative for fever, chills, night sweats, anorexia, weight loss, pain. Cardiovascular: no chest pain or dyspnea on exertion Respiratory: no cough, shortness of breath, or wheezing Neurological: no TIA or stroke symptoms Dermatological: negative ENT: negative Skin Gastrointestinal: no abdominal pain, change in bowel habits, or black or bloody stools Genito-Urinary: no dysuria, trouble voiding, or hematuria Hematological and Lymphatic: negative  Musculoskeletal: negative Remaining ROS negative.  Physical Exam:  Blood pressure 145/78, pulse 64, temperature 98.3 F (36.8 C), temperature source Oral, height 5' 8.5" (1.74 m), weight 202 lb 6.4 oz (91.808 kg).  ECOG: 1   General appearance: alert and cooperative Eyes: conjunctivae/corneas clear. PERRL, EOM's intact. Fundi  benign. Throat: lips, mucosa, and tongue normal; teeth and gums normal Neck: no adenopathy, no carotid bruit, no JVD, supple, symmetrical, trachea midline and thyroid not enlarged, symmetric, no tenderness/mass/nodules Back: symmetric, no curvature. ROM normal. No CVA tenderness. Resp: clear to auscultation bilaterally and normal percussion bilaterally Cardio: regular rate and rhythm, S1, S2 normal, no murmur, click, rub or gallop and normal apical impulse GI: soft, non-tender; bowel sounds normal; no masses,  no organomegaly Extremities: extremities normal, atraumatic, no cyanosis or edema Skin: angiomata - generalized Neurologic: Alert and oriented X 3, normal strength and tone. Normal symmetric reflexes. Normal coordination and gait Sensory: normal Motor: grossly normal   Lab Results: Lab Results  Component Value Date   WBC 10.1 02/17/2010   HGB 16.6 08/05/2010   HCT 48.3 08/05/2010   MCV 94.8 08/05/2010   PLT 128* 08/05/2010     Chemistry      Component Value Date/Time   NA 142 02/17/2010 2005   NA 142 07/27/2008 0841   K 4.8 02/17/2010 2005   K 4.6 07/27/2008 0841   CL 104 02/17/2010 2005   CL 99 07/27/2008 0841   CO2 29 02/17/2010 2005   CO2 29 07/27/2008 0841   BUN 23 02/17/2010 2005   BUN 17 07/27/2008 0841   CREATININE 1.3 02/17/2010 2005   CREATININE 1.4* 07/27/2008 0841      Component Value Date/Time   CALCIUM 9.6 02/17/2010 2005   CALCIUM 9.2 07/27/2008 0841   ALKPHOS 111 02/17/2010 2005   ALKPHOS 91* 07/27/2008 0841   AST 37 02/17/2010 2005   AST 25 07/27/2008 0841   ALT 23 02/17/2010 2005   BILITOT 1.0 02/17/2010 2005   BILITOT 1.00 07/27/2008 0841  Radiological Studies:  No results found.   IMPRESSIONS AND PLAN: A 77 y.o. male with   Polycythemia that is secondary to possibly obstructive sleep apnea. He has received phlebotomies in the past. However we held his phlebotomies most recently due to iron depletion. Does feel fatigued today and his hemoglobin  is slightly up to 16.7. Have checked iron studies on him to see whether he is beginning to reaccumulate his iron stores. If he does have a iron overload again then we will proceed with doing the phlebotomy. He understands the risks and benefits of phlebotomies. And he is very much open to getting a phlebotomy since it does help him quite a bit. He seems to be in complete remission without evidence of disease for his colon cancer. We will continue to monitor him as well. He will be seen back in 6 months time however I certainly can see him sooner if need arises. We will call him with the results of his iron studies and phlebotomize him as needed.    Spent more than half the time coordinating care.    [name], MD Medical/Oncology [facility] [phone] (beeper) [phone] (Office)  03/12/2011, 6:38 PM 11/8/20126:38 PM

## 2011-03-12 NOTE — Telephone Encounter (Signed)
Called pt and informed him of appt on 11/16

## 2011-03-13 ENCOUNTER — Telehealth: Payer: Self-pay | Admitting: *Deleted

## 2011-03-13 NOTE — Telephone Encounter (Signed)
Per MD, left pt a message stating that our scheduler will be calling with an appointment time for a phlebotomy on 03/20/11

## 2011-03-14 ENCOUNTER — Other Ambulatory Visit: Payer: Self-pay | Admitting: Oncology

## 2011-03-14 DIAGNOSIS — D751 Secondary polycythemia: Secondary | ICD-10-CM

## 2011-03-19 ENCOUNTER — Other Ambulatory Visit: Payer: Self-pay | Admitting: Oncology

## 2011-03-19 DIAGNOSIS — D751 Secondary polycythemia: Secondary | ICD-10-CM

## 2011-03-23 ENCOUNTER — Telehealth: Payer: Self-pay | Admitting: *Deleted

## 2011-03-25 ENCOUNTER — Other Ambulatory Visit: Payer: Self-pay | Admitting: Oncology

## 2011-03-25 ENCOUNTER — Ambulatory Visit: Payer: Medicare Other

## 2011-03-25 VITALS — BP 139/86 | HR 58

## 2011-03-25 DIAGNOSIS — D751 Secondary polycythemia: Secondary | ICD-10-CM

## 2011-03-25 NOTE — Patient Instructions (Signed)
1552-Pt discharged ambulatory with next appointment confirmed.  Pt aware to call with any questions or concerns.

## 2011-03-25 NOTE — Progress Notes (Signed)
Phlebotomy: Asked by Charge nurse Jan to assist Leann RN with phlebotomy. Pt. Has lt. Antecubital site accessed, approximately 30 ml obtained and needle clotted.  New site started by Chrystie Nose RN via rt. Antecubital site and line clotted at approximately 315g.  Pt.requested to continue with another site to complete phlebotomy. Third site used via rt. forearm with empty bag and 20g angiocatheter method and total of 500 g obtained today and completed phlebotomy without any adverse event. Pt. Tolerated procedure well.  Advised to increase oral fluid intake prior to next phlebotomy visit. Call if he has further issues or problems.

## 2011-05-08 ENCOUNTER — Ambulatory Visit (INDEPENDENT_AMBULATORY_CARE_PROVIDER_SITE_OTHER): Payer: Medicare Other | Admitting: Family

## 2011-05-08 ENCOUNTER — Encounter: Payer: Self-pay | Admitting: Family

## 2011-05-08 DIAGNOSIS — Z85038 Personal history of other malignant neoplasm of large intestine: Secondary | ICD-10-CM

## 2011-05-08 DIAGNOSIS — I10 Essential (primary) hypertension: Secondary | ICD-10-CM

## 2011-05-08 DIAGNOSIS — N400 Enlarged prostate without lower urinary tract symptoms: Secondary | ICD-10-CM

## 2011-05-08 MED ORDER — ATENOLOL 25 MG PO TABS: 25.0000 mg | ORAL_TABLET | Freq: Two times a day (BID) | ORAL | Status: DC

## 2011-05-08 NOTE — Assessment & Plan Note (Signed)
BP stable on current dose of atenolol.  Continue same.  

## 2011-05-08 NOTE — Assessment & Plan Note (Signed)
This is being managed with phlebotomy by hematology.  (Dr. Park Breed)

## 2011-05-08 NOTE — Progress Notes (Signed)
  Subjective:    Patient ID: Brandon Hensley, male    DOB: 06-Feb-1927, 78 y.o.   MRN: [id number]  HPI  Mr.  Hensley is an 78 yr old male who presents today to establish care. She has seen Dr. Posey Rea in the past.    HTN- pt is treated with atenolol.  Denies chest pain, sob or LE edema.   Polycythemia Vera/Iron overload-  He follows with Dr. Park Breed and has phlebotomy (last several weeks ago.)  Hx of colon cancer-  He is s/p colectomy in 2005.   BPH- He takes flomax and denies current urinary issues.  He is followed at [facility].     Review of Systems  Constitutional: Negative for fever and unexpected weight change.  HENT:       Recent nasal congestion/uri, now resolved.  Eyes: Negative for visual disturbance.  Respiratory: Negative for shortness of breath.   Cardiovascular: Negative for chest pain.  Gastrointestinal: Negative for blood in stool.  Genitourinary: Negative for difficulty urinating.  Musculoskeletal: Negative for myalgias and arthralgias.  Skin: Negative for rash.  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  Psychiatric/Behavioral:       He reports that he enjoys being busy.         Objective:   Physical Exam  Constitutional: He appears well-developed and well-nourished. No distress.  HENT:  Head: Normocephalic and atraumatic.  Mouth/Throat: No oropharyngeal exudate.  Eyes: Conjunctivae are normal. Pupils are equal, round, and reactive to light.  Neck: Normal range of motion. Neck supple.  Cardiovascular: Normal rate and regular rhythm.   No murmur heard. Pulmonary/Chest: Effort normal and breath sounds normal. No respiratory distress. He has no wheezes. He has no rales. He exhibits no tenderness.  Abdominal: Bowel sounds are normal.  Musculoskeletal: He exhibits no edema.  Lymphadenopathy:    He has no cervical  adenopathy.  Skin: Skin is warm and dry.  Psychiatric: He has a normal mood and affect. His behavior is normal. Judgment and thought content normal.          Assessment & Plan:

## 2011-05-08 NOTE — Assessment & Plan Note (Signed)
This is managed by Urology and he reports that it is well controlled with finasteride/tamsulosin combo.

## 2011-05-08 NOTE — Assessment & Plan Note (Signed)
He had neg colo at [facility] in 2011 and it was recommended that he have a follow up colo in 5 yrs.

## 2011-05-08 NOTE — Patient Instructions (Signed)
Please follow up in 3 months for a medicare wellness visit. Come fasting to this appointment.  Welcome to [facility] in [place]!

## 2011-06-15 ENCOUNTER — Other Ambulatory Visit: Payer: Medicare Other | Admitting: Lab

## 2011-06-15 ENCOUNTER — Encounter: Payer: Self-pay | Admitting: Oncology

## 2011-06-15 ENCOUNTER — Ambulatory Visit (HOSPITAL_BASED_OUTPATIENT_CLINIC_OR_DEPARTMENT_OTHER): Payer: Medicare Other | Admitting: Oncology

## 2011-06-15 ENCOUNTER — Telehealth: Payer: Self-pay | Admitting: Oncology

## 2011-06-15 VITALS — BP 139/84 | HR 60 | Temp 97.9°F | Ht 68.5 in | Wt 207.4 lb

## 2011-06-15 DIAGNOSIS — D45 Polycythemia vera: Secondary | ICD-10-CM

## 2011-06-15 DIAGNOSIS — D508 Other iron deficiency anemias: Secondary | ICD-10-CM

## 2011-06-15 DIAGNOSIS — D751 Secondary polycythemia: Secondary | ICD-10-CM

## 2011-06-15 LAB — CBC WITH DIFFERENTIAL/PLATELET
Basophils Absolute: 0.1 10*3/uL (ref 0.0–0.1)
HCT: 50 % — ABNORMAL HIGH (ref 38.4–49.9)
HGB: 17 g/dL (ref 13.0–17.1)
LYMPH%: 27.7 % (ref 14.0–49.0)
MONO#: 0.6 10*3/uL (ref 0.1–0.9)
NEUT%: 58.2 % (ref 39.0–75.0)
Platelets: 147 10*3/uL (ref 140–400)
WBC: 7.9 10*3/uL (ref 4.0–10.3)
lymph#: 2.2 10*3/uL (ref 0.9–3.3)

## 2011-06-15 LAB — IRON AND TIBC
%SAT: 36 % (ref 20–55)
TIBC: 303 ug/dL (ref 215–435)

## 2011-06-15 LAB — FERRITIN: Ferritin: 32 ng/mL (ref 22–322)

## 2011-06-15 NOTE — Progress Notes (Signed)
Hematology and Oncology Follow Up Visit  Brandon Hensley [id number] [birth year] 78 y.o. 06/15/2011 2:36 PM   DIAGNOSIS: 79 year old gentleman with:  1.  secondary polycythemia.  2. Stage I colon cancer  Encounter Diagnosis  Name Primary?  . Polycythemia Yes     PAST THERAPY: She has had multiple phlebotomies in the past also has a history of colon cancer which was stage I diagnosed originally in November 2003 he is N ED.   Interim History:  Overall he is doing well, he however has begun to feel tired and has noticed more redness and fatigue. No nausea or vomiting, fevers or chills, no bleeding problems, No peripheral paresthesias Remainder of the 10 point review is negative  Medications: I have reviewed the patient's current medications.  Allergies: No known drug allergies.  Past Medical History, Surgical history, Social history, and Family History were reviewed and updated.  Review of Systems: Constitutional:  Negative for fever, chills, night sweats, anorexia, weight loss, pain. Cardiovascular: no chest pain or dyspnea on exertion Respiratory: no cough, shortness of breath, or wheezing Neurological: no TIA or stroke symptoms Dermatological: negative ENT: negative Skin Gastrointestinal: no abdominal pain, change in bowel habits, or black or bloody stools Genito-Urinary: no dysuria, trouble voiding, or hematuria Hematological and Lymphatic: negative  Musculoskeletal: negative Remaining ROS negative.  Physical Exam:  Blood pressure 139/84, pulse 60, temperature 97.9 F (36.6 C), temperature source Oral, height 5' 8.5" (1.74 m), weight 207 lb 6.4 oz (94.076 kg).  ECOG: 1   General appearance: alert and cooperative Eyes: conjunctivae/corneas clear. PERRL, EOM's intact. Fundi benign. Throat: lips, mucosa, and tongue normal; teeth and gums normal Neck: no adenopathy, no carotid bruit, no JVD, supple, symmetrical, trachea midline and thyroid not enlarged, symmetric,  no tenderness/mass/nodules Back: symmetric, no curvature. ROM normal. No CVA tenderness. Resp: clear to auscultation bilaterally and normal percussion bilaterally Cardio: regular rate and rhythm, S1, S2 normal, no murmur, click, rub or gallop and normal apical impulse GI: soft, non-tender; bowel sounds normal; no masses,  no organomegaly Extremities: extremities normal, atraumatic, no cyanosis or edema Skin: angiomata - generalized Neurologic: Alert and oriented X 3, normal strength and tone. Normal symmetric reflexes. Normal coordination and gait Sensory: normal Motor: grossly normal   Lab Results: Lab Results  Component Value Date   WBC 7.9 06/15/2011   HGB 17.0 06/15/2011   HCT 50.0* 06/15/2011   MCV 94.5 06/15/2011   PLT 147 06/15/2011     Chemistry      Component Value Date/Time   NA 142 02/17/2010 2005   NA 142 07/27/2008 0841   K 4.8 02/17/2010 2005   K 4.6 07/27/2008 0841   CL 104 02/17/2010 2005   CL 99 07/27/2008 0841   CO2 29 02/17/2010 2005   CO2 29 07/27/2008 0841   BUN 23 02/17/2010 2005   BUN 17 07/27/2008 0841   CREATININE 1.3 02/17/2010 2005   CREATININE 1.4* 07/27/2008 0841      Component Value Date/Time   CALCIUM 9.6 02/17/2010 2005   CALCIUM 9.2 07/27/2008 0841   ALKPHOS 111 02/17/2010 2005   ALKPHOS 91* 07/27/2008 0841   AST 37 02/17/2010 2005   AST 25 07/27/2008 0841   ALT 23 02/17/2010 2005   BILITOT 1.0 02/17/2010 2005   BILITOT 1.00 07/27/2008 0841       Radiological Studies:  No results found.   IMPRESSIONS AND PLAN: A 78 y.o. male with  1. Polycythemia that is secondary to possibly obstructive sleep apnea. He  has received phlebotomies in the past. Today his H/H is elevated and he will require a phlebotomy.   2. Do phlebotomy on 06/17/11 and every month x 3  3. RTC 3 months with NR he will needs cbc/iron studies  Visit 30 minutes and face to face time 25 minutes.   Brandon Second, MD Medical/Oncology [facility] [phone]  (beeper) [phone] (Office)  06/15/2011, 2:36 PM 2/11/20132:36 PM

## 2011-06-15 NOTE — Telephone Encounter (Signed)
gve the pt his feb,may 2013 appt calendar °

## 2011-06-17 ENCOUNTER — Ambulatory Visit (HOSPITAL_BASED_OUTPATIENT_CLINIC_OR_DEPARTMENT_OTHER): Payer: Medicare Other

## 2011-06-17 ENCOUNTER — Other Ambulatory Visit: Payer: Medicare Other

## 2011-06-17 VITALS — BP 158/73 | HR 55 | Temp 97.3°F

## 2011-06-17 DIAGNOSIS — D45 Polycythemia vera: Secondary | ICD-10-CM

## 2011-06-17 DIAGNOSIS — D751 Secondary polycythemia: Secondary | ICD-10-CM

## 2011-06-17 LAB — CBC WITH DIFFERENTIAL/PLATELET
Eosinophils Absolute: 0.5 10*3/uL (ref 0.0–0.5)
HGB: 16.9 g/dL (ref 13.0–17.1)
MONO#: 1 10*3/uL — ABNORMAL HIGH (ref 0.1–0.9)
MONO%: 12.2 % (ref 0.0–14.0)
NEUT#: 4.4 10*3/uL (ref 1.5–6.5)
RBC: 5.24 10*6/uL (ref 4.20–5.82)
RDW: 13.2 % (ref 11.0–14.6)
WBC: 8.1 10*3/uL (ref 4.0–10.3)
lymph#: 2.3 10*3/uL (ref 0.9–3.3)
nRBC: 0 % (ref 0–0)

## 2011-06-17 NOTE — Progress Notes (Signed)
580 gram phlebotomy obtained from Left antecubital. Site unremarkable, pressure dressing applied.  Pt tolerated without difficulty. Refreshments given. Discharge teaching complete, to include increase oral hydration, change positions slowly. Pt verbalized understanding.

## 2011-06-18 ENCOUNTER — Telehealth: Payer: Self-pay | Admitting: *Deleted

## 2011-06-18 ENCOUNTER — Encounter: Payer: Self-pay | Admitting: *Deleted

## 2011-06-18 NOTE — Telephone Encounter (Signed)
Pt received Phlebotomy on 2/13. Reviewed pt's next 2 appts scheduled for Phlebotomy.  Per MD Cancel Lab/Phlebotomy on 2/20 & 2/27-pt notified. Pt verbalized understanding.  Onc Tx Sent

## 2011-06-19 ENCOUNTER — Other Ambulatory Visit: Payer: Self-pay | Admitting: *Deleted

## 2011-06-22 ENCOUNTER — Encounter: Payer: Self-pay | Admitting: *Deleted

## 2011-06-24 ENCOUNTER — Other Ambulatory Visit: Payer: Medicare Other | Admitting: Lab

## 2011-07-01 ENCOUNTER — Other Ambulatory Visit: Payer: Medicare Other | Admitting: Lab

## 2011-08-07 ENCOUNTER — Encounter: Payer: Medicare Other | Admitting: Family

## 2011-08-11 ENCOUNTER — Encounter: Payer: Self-pay | Admitting: Family

## 2011-08-11 ENCOUNTER — Ambulatory Visit (INDEPENDENT_AMBULATORY_CARE_PROVIDER_SITE_OTHER): Payer: Medicare Other | Admitting: Family

## 2011-08-11 VITALS — BP 128/82 | HR 52 | Temp 97.9°F | Resp 16 | Ht 68.5 in | Wt 202.0 lb

## 2011-08-11 DIAGNOSIS — N400 Enlarged prostate without lower urinary tract symptoms: Secondary | ICD-10-CM

## 2011-08-11 DIAGNOSIS — I10 Essential (primary) hypertension: Secondary | ICD-10-CM

## 2011-08-11 DIAGNOSIS — E663 Overweight: Secondary | ICD-10-CM

## 2011-08-11 DIAGNOSIS — D45 Polycythemia vera: Secondary | ICD-10-CM

## 2011-08-11 DIAGNOSIS — Z0000 Encounter for general adult medical examination without abnormal findings: Secondary | ICD-10-CM

## 2011-08-11 DIAGNOSIS — R9431 Abnormal electrocardiogram [ECG] [EKG]: Secondary | ICD-10-CM

## 2011-08-11 NOTE — Patient Instructions (Signed)
Please complete your lab work prior to leaving today. Check with your insurance re: shingles vaccine coverage (zostavax).  Please call for nurse visit if you wish to proceed. Follow up in 6 months- sooner if problems/concerns.

## 2011-08-11 NOTE — Progress Notes (Signed)
Subjective:    Patient ID: Brandon Hensley, male    DOB: 10/31/1926, 78 y.o.   MRN: [id number]  HPI Subjective:   Patient here for Medicare annual wellness visit   Left lateral foot  Numbness.  Denies Low back pain.  Symptoms started about 1-2 months ago- tells me that he does not wish to pursue further work up of this.    Risk factors: At risk for CAD due to age and HTN.   Roster of Physicians Providing Medical Care to Patient: Dr. Elmer Picker Urology Dr. Welton Flakes  Activities of Daily Living  In your present state of health, do you have any difficulty performing the following activities? Preparing food and eating?: No  Bathing yourself: No  Getting dressed: No  Using the toilet:No  Moving around from place to place: No  In the past year have you fallen or had a near fall?:No    Home Safety: Has smoke detector and wears seat belts. No firearms. No excess sun exposure.  Diet and Exercise  Current exercise habits: Reports that he exercises 5 days a week, treadmill 5 days a week. Dietary issues discussed: healthy diet   Depression Screen  (Note: if answer to either of the following is "Yes", then a more complete depression screening is indicated)  Q1: Over the past two weeks, have you felt down, depressed or hopeless?no  Q2: Over the past two weeks, have you felt little interest or pleasure in doing things? no   The following portions of the patient's history were reviewed and updated as appropriate: allergies, current medications, past family history, past medical history, past social history, past surgical history and problem list.    Review of Systems  Some numbness left lateral foot.  Denies low back pain.  Objective:   Vision:see nursing. Hearing: Report right ear not as good as left.  Declines hearing testing.   Body mass index: see vitals Cognitive Impairment Assessment: cognition, memory and judgment appear normal.   Assessment:   Medicare wellness utd on preventive  parameters   Last colo was 2 yrs ago. Was told 5 yr follow up.  Never had bone density.   Will consider zostavax. Encouraged patient to continue exercise and healthy diet.  Plan:    During the course of the visit the patient was educated and counseled about appropriate screening and preventive services including:        Bone densitometry screening-declines Diabetes screening- check bmet Vaccines / LABS PSA, lipids, bmet today  Patient Instructions (the written plan) was given to the patient.        Review of Systems     Objective:   Physical Exam  Constitutional: He appears well-developed and well-nourished. No distress.  HENT:  Head: Normocephalic and atraumatic.  Mouth/Throat: No oropharyngeal exudate.  Eyes: Conjunctivae are normal. Pupils are equal, round, and reactive to light.  Neck: Neck supple.  Cardiovascular: Normal rate.   No murmur heard.      Mild bradycardia  Pulmonary/Chest: Effort normal and breath sounds normal.  Abdominal: Soft. Bowel sounds are normal. He exhibits no distension and no mass. There is no tenderness. There is no rebound and no guarding.  Genitourinary: Guaiac negative stool.       Prostate is smooth, enlarged.  No palpable nodules noted.   Lymphadenopathy:    He has no cervical adenopathy.  Skin:       Scar left shoulder  Psychiatric: He has a normal mood and affect. His speech is normal and  behavior is normal. Judgment and thought content normal. Cognition and memory are normal.          Assessment & Plan:  EKG- Sinus brady.  ? Old anterior infarct, ?nonspecific ZRS widening. Pt is asymtomatic. Reviewed EKG with Dr. Rodena Medin.  Will plan to repeat next visit.

## 2011-08-12 ENCOUNTER — Encounter: Payer: Self-pay | Admitting: Family

## 2011-08-12 DIAGNOSIS — E875 Hyperkalemia: Secondary | ICD-10-CM | POA: Insufficient documentation

## 2011-08-12 LAB — BASIC METABOLIC PANEL WITH GFR
CO2: 29 mEq/L (ref 19–32)
Calcium: 10 mg/dL (ref 8.4–10.5)
Potassium: 5.5 mEq/L — ABNORMAL HIGH (ref 3.5–5.3)
Sodium: 144 mEq/L (ref 135–145)

## 2011-08-12 LAB — CBC WITH DIFFERENTIAL/PLATELET
Basophils Relative: 0 % (ref 0–1)
HCT: 52 % (ref 39.0–52.0)
Hemoglobin: 17.1 g/dL — ABNORMAL HIGH (ref 13.0–17.0)
Lymphocytes Relative: 31 % (ref 12–46)
MCHC: 32.9 g/dL (ref 30.0–36.0)
Monocytes Absolute: 0.8 10*3/uL (ref 0.1–1.0)
Monocytes Relative: 10 % (ref 3–12)
Neutro Abs: 4.4 10*3/uL (ref 1.7–7.7)

## 2011-08-12 LAB — LIPID PANEL
LDL Cholesterol: 94 mg/dL (ref 0–99)
Triglycerides: 69 mg/dL (ref <150)

## 2011-08-14 ENCOUNTER — Telehealth: Payer: Self-pay | Admitting: *Deleted

## 2011-08-14 DIAGNOSIS — E875 Hyperkalemia: Secondary | ICD-10-CM

## 2011-08-14 NOTE — Telephone Encounter (Signed)
Future lab order placed for 08/18/11 for BMP per lab result note. Order forwarded to the lab.

## 2011-08-18 NOTE — Telephone Encounter (Signed)
Pt presented to the lab, order released and given to the lab. 

## 2011-08-18 NOTE — Telephone Encounter (Signed)
Addended by: Mervin Kung A on: 08/18/2011 02:36 PM   Modules accepted: Orders

## 2011-08-19 ENCOUNTER — Telehealth: Payer: Self-pay | Admitting: Family

## 2011-08-19 LAB — BASIC METABOLIC PANEL
CO2: 24 mEq/L (ref 19–32)
Chloride: 102 mEq/L (ref 96–112)
Glucose, Bld: 85 mg/dL (ref 70–99)
Potassium: 4.9 mEq/L (ref 3.5–5.3)
Sodium: 137 mEq/L (ref 135–145)

## 2011-08-19 NOTE — Telephone Encounter (Signed)
Notified pt and he voices understanding. 

## 2011-08-19 NOTE — Telephone Encounter (Signed)
Pls call pt and let him know that his follow up potassium level is normal.  He should continue the low potassium diet.

## 2011-09-10 ENCOUNTER — Other Ambulatory Visit: Payer: Medicare Other | Admitting: Lab

## 2011-09-10 ENCOUNTER — Ambulatory Visit: Payer: Medicare Other | Admitting: Family

## 2011-11-10 ENCOUNTER — Encounter: Payer: Self-pay | Admitting: Family

## 2011-11-10 ENCOUNTER — Ambulatory Visit (INDEPENDENT_AMBULATORY_CARE_PROVIDER_SITE_OTHER): Payer: Medicare Other | Admitting: Family

## 2011-11-10 VITALS — BP 124/84 | HR 78 | Temp 97.8°F | Resp 16 | Wt 208.0 lb

## 2011-11-10 DIAGNOSIS — I10 Essential (primary) hypertension: Secondary | ICD-10-CM

## 2011-11-10 DIAGNOSIS — R51 Headache: Secondary | ICD-10-CM

## 2011-11-10 DIAGNOSIS — K1120 Sialoadenitis, unspecified: Secondary | ICD-10-CM

## 2011-11-10 DIAGNOSIS — R519 Headache, unspecified: Secondary | ICD-10-CM | POA: Insufficient documentation

## 2011-11-10 MED ORDER — TRAMADOL HCL 50 MG PO TABS
50.0000 mg | ORAL_TABLET | Freq: Three times a day (TID) | ORAL | Status: AC | PRN
Start: 2011-11-10 — End: 2011-11-20

## 2011-11-10 MED ORDER — AMOXICILLIN-POT CLAVULANATE 875-125 MG PO TABS: 1.0000 | ORAL_TABLET | Freq: Two times a day (BID) | ORAL | Status: AC

## 2011-11-10 NOTE — Assessment & Plan Note (Signed)
Exacerbated by arthritis in neck. Rx with tramadol, advised pt that this can cause drowsiness and to use sparingly.

## 2011-11-10 NOTE — Progress Notes (Signed)
Subjective:    Patient ID: Brandon Hensley, male    DOB: Mar 13, 1927, 78 y.o.   MRN: [id number]  HPI  Mr.  Brandon Hensley is an 78 yr old male who presents today with several concerns:  Swelling- L side of jaw- swollen since yesterday.  Mild tenderness.  He denies known fever or sore throat.   Headaches- He reports that he does have headaches which he attributes to the arthritis in his neck.  Has been using large amounts of aspirin.  No improvement in HA  with tylenol. Requesting something stronger to be used as needed.  HTN- reports that his wife has been monitoring his bp at home and that it has been stable.  He has been taking atenolol once daily only.      Review of Systems See HPI  Past Medical History  Diagnosis Date  . Hypertension   . Generalized headaches   . Cancer     colon  . Kidney stones   . Colon polyp   . BPH (benign prostatic hypertrophy)   . Polycythemia     History   Social History  . Marital Status: Married    Spouse Name: N/A    Number of Children: 2  . Years of Education: N/A   Occupational History  . Not on file.   Social History Main Topics  . Smoking status: Never Smoker   . Smokeless tobacco: Never Used  . Alcohol Use: Yes     RARELY  . Drug Use: Not on file  . Sexually Active: Not on file   Other Topics Concern  . Not on file   Social History Narrative   CAFFEINE USE:  1 cup coffee dailyRegular exercise:  5 x weeklyMarried for 1 year to a woman who is 46.  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     Past Surgical History  Procedure Date  . Colon surgery 2005    colon cancer  . Hernia repair 2005    ?inguinal  . Hand surgery 2009    left hand, ?carpal tunnel release    Family History  Problem Relation Age of Onset  . Heart disease Mother     due to rheumatic fever    No Known Allergies  Current Outpatient Prescriptions on File Prior to Visit  Medication Sig Dispense Refill  . aspirin 325 MG  tablet Take 325 mg by mouth daily.        . cholecalciferol (VITAMIN D) 1000 UNITS tablet Take 1,000 Units by mouth daily.        . finasteride (PROPECIA) 1 MG tablet Take 1 mg by mouth daily.        . fish oil-omega-3 fatty acids 1000 MG capsule Take 1 g by mouth daily.        . Tamsulosin HCl (FLOMAX) 0.4 MG CAPS Take by mouth.        . DISCONTD: atenolol (TENORMIN) 25 MG tablet Take 1 tablet (25 mg total) by mouth 2 (two) times daily.  60 tablet  5    BP 124/84  Pulse 78  Temp 97.8 F (36.6 C) (Oral)  Resp 16  Wt 208 lb (94.348 kg)  SpO2 98%       Objective:   Physical Exam  Constitutional: He appears well-developed and well-nourished. No distress.  HENT:  Head: Normocephalic and atraumatic.  Right Ear: Tympanic membrane and ear canal normal.  Left Ear: Tympanic membrane and ear canal normal.  Mouth/Throat:  No oropharyngeal exudate, posterior oropharyngeal edema or posterior oropharyngeal erythema.       Swollen parotid gland left, non-tender.  Cardiovascular: Normal rate and regular rhythm.   No murmur heard. Pulmonary/Chest: Effort normal and breath sounds normal. No respiratory distress. He has no wheezes. He has no rales. He exhibits no tenderness.  Skin: Skin is warm and dry.  Psychiatric: He has a normal mood and affect. His behavior is normal. Judgment and thought content normal.          Assessment & Plan:

## 2011-11-10 NOTE — Assessment & Plan Note (Signed)
BP looks good on the once daily dosing of atenolol 25mg .  Continue same.

## 2011-11-10 NOTE — Patient Instructions (Addendum)
Please follow up in 1 week, sooner if increased swelling, fever or if symptoms worsen.

## 2011-11-10 NOTE — Assessment & Plan Note (Signed)
Will rx with augmentin bid.  Pt to follow up in 1 week. He is instructed to call if increased pain, swelling or fever occur.

## 2011-11-18 ENCOUNTER — Encounter: Payer: Self-pay | Admitting: Family

## 2011-11-18 ENCOUNTER — Ambulatory Visit (INDEPENDENT_AMBULATORY_CARE_PROVIDER_SITE_OTHER): Payer: Medicare Other | Admitting: Family

## 2011-11-18 ENCOUNTER — Telehealth: Payer: Self-pay | Admitting: Family

## 2011-11-18 VITALS — BP 128/88 | HR 74 | Temp 97.9°F | Resp 16 | Ht 68.5 in | Wt 206.1 lb

## 2011-11-18 DIAGNOSIS — R220 Localized swelling, mass and lump, head: Secondary | ICD-10-CM

## 2011-11-18 DIAGNOSIS — I10 Essential (primary) hypertension: Secondary | ICD-10-CM

## 2011-11-18 DIAGNOSIS — R221 Localized swelling, mass and lump, neck: Secondary | ICD-10-CM

## 2011-11-18 DIAGNOSIS — K1120 Sialoadenitis, unspecified: Secondary | ICD-10-CM

## 2011-11-18 LAB — BASIC METABOLIC PANEL
CO2: 26 mEq/L (ref 19–32)
Calcium: 9.4 mg/dL (ref 8.4–10.5)
Creat: 1.29 mg/dL (ref 0.50–1.35)
Glucose, Bld: 91 mg/dL (ref 70–99)

## 2011-11-18 NOTE — Telephone Encounter (Signed)
Yes spoke with him and advised I had cancelled the ct for tomorrow and would advise him when we hear from the insurance company

## 2011-11-18 NOTE — Progress Notes (Signed)
Subjective:    Patient ID: Brandon Hensley, male    DOB: [birth year], 78 y.o.   MRN: [id number]  HPI  Brandon Hensley is an 78 yr old male who presents today for follow up of his parotitis.  He continues augmentin and notes significant improvement in the swelling.  Denies fever.  Review of Systems    see hpi  Past Medical History  Diagnosis Date  . Hypertension   . Generalized headaches   . Cancer     colon  . Kidney stones   . Colon polyp   . BPH (benign prostatic hypertrophy)   . Polycythemia     History   Social History  . Marital Status: Married    Spouse Name: N/A    Number of Children: 2  . Years of Education: N/A   Occupational History  . Not on file.   Social History Main Topics  . Smoking status: Never Smoker   . Smokeless tobacco: Never Used  . Alcohol Use: Yes     RARELY  . Drug Use: Not on file  . Sexually Active: Not on file   Other Topics Concern  . Not on file   Social History Narrative   CAFFEINE USE:  1 cup coffee dailyRegular exercise:  5 x weeklyMarried for 1 year to a woman who is 93.  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     Past Surgical History  Procedure Date  . Colon surgery 2005    colon cancer  . Hernia repair 2005    ?inguinal  . Hand surgery 2009    left hand, ?carpal tunnel release    Family History  Problem Relation Age of Onset  . Heart disease Mother     due to rheumatic fever    No Known Allergies  Current Outpatient Prescriptions on File Prior to Visit  Medication Sig Dispense Refill  . amoxicillin-clavulanate (AUGMENTIN) 875-125 MG per tablet Take 1 tablet by mouth 2 (two) times daily.  20 tablet  0  . aspirin 325 MG tablet Take 325 mg by mouth daily.        Marland Kitchen atenolol (TENORMIN) 25 MG tablet Take 25 mg by mouth daily.      . cholecalciferol (VITAMIN D) 1000 UNITS tablet Take 1,000 Units by mouth daily.        . finasteride (PROPECIA) 1 MG tablet Take 1 mg by mouth daily.         . fish oil-omega-3 fatty acids 1000 MG capsule Take 1 g by mouth daily.        . Tamsulosin HCl (FLOMAX) 0.4 MG CAPS Take by mouth.        . traMADol (ULTRAM) 50 MG tablet Take 1 tablet (50 mg total) by mouth every 8 (eight) hours as needed for pain.  30 tablet  0    BP 128/88  Pulse 74  Temp 97.9 F (36.6 C) (Oral)  Resp 16  Ht 5' 8.5" (1.74 m)  Wt 206 lb 1.3 oz (93.477 kg)  BMI 30.88 kg/m2  SpO2 98%    Objective:   Physical Exam  Constitutional: He appears well-developed and well-nourished.  Neck:       Mass left neck only slightly improved. Minimally tender.  Cardiovascular: Normal rate and regular rhythm.   No murmur heard. Pulmonary/Chest: Effort normal and breath sounds normal. No respiratory distress. He has no wheezes. He has no rales. He exhibits no tenderness.  Psychiatric: He has  a normal mood and affect. Judgment normal.          Assessment & Plan:

## 2011-11-18 NOTE — Telephone Encounter (Signed)
Called [facility] to precert ct of the neck.  They have sent the information for clinical review and will make a decision within 2 days.  Have cancelled the procedure for tomorrow.  Will reschedule the procedure when we get an approval from [facility].

## 2011-11-18 NOTE — Telephone Encounter (Signed)
Is patient aware of cancellation?

## 2011-11-18 NOTE — Patient Instructions (Addendum)
Continue Augmentin. Complete lab work prior to leaving. You will be contacted about your neck CT. Follow up in 2 weeks.

## 2011-11-18 NOTE — Assessment & Plan Note (Signed)
Will check creatinine, then proceed with ct neck with contrast to further evaluate.

## 2011-11-19 ENCOUNTER — Other Ambulatory Visit (HOSPITAL_BASED_OUTPATIENT_CLINIC_OR_DEPARTMENT_OTHER): Payer: Medicare Other

## 2011-11-23 ENCOUNTER — Telehealth: Payer: Self-pay | Admitting: *Deleted

## 2011-11-23 DIAGNOSIS — R221 Localized swelling, mass and lump, neck: Secondary | ICD-10-CM

## 2011-11-23 NOTE — Telephone Encounter (Signed)
Notified pt ok to proceed with CT of neck per lab note of 11/18/11. Looks like original CT order was cancelled. Will you place a new order?

## 2011-11-24 NOTE — Telephone Encounter (Signed)
Order in, awaiting [employer] prior auth.  Pls let pt know that we will contact him as soon as we hear back from [facility].

## 2011-11-24 NOTE — Telephone Encounter (Signed)
Notified pt that we are waiting to hear decision from insurance re: approval/denial. Additional info was faxed to the insurance on 11/23/11 per pt care coordinator.

## 2011-11-30 NOTE — Telephone Encounter (Signed)
Pls call pt and let him know that his insurance has denied to pay for the CT neck that we have ordered.  Instead, I will order an ultrasound of his neck and go from there.  Brandon Hensley should contact him about this test.

## 2011-12-01 ENCOUNTER — Ambulatory Visit (HOSPITAL_BASED_OUTPATIENT_CLINIC_OR_DEPARTMENT_OTHER)
Admission: RE | Admit: 2011-12-01 | Discharge: 2011-12-01 | Disposition: A | Discharge status: Home / Self Care | Payer: Medicare Other | Source: Ambulatory Visit | Attending: Family | Admitting: Family

## 2011-12-01 DIAGNOSIS — R220 Localized swelling, mass and lump, head: Secondary | ICD-10-CM | POA: Insufficient documentation

## 2011-12-01 DIAGNOSIS — Z85038 Personal history of other malignant neoplasm of large intestine: Secondary | ICD-10-CM | POA: Insufficient documentation

## 2011-12-01 DIAGNOSIS — R599 Enlarged lymph nodes, unspecified: Secondary | ICD-10-CM | POA: Insufficient documentation

## 2011-12-01 DIAGNOSIS — R221 Localized swelling, mass and lump, neck: Secondary | ICD-10-CM | POA: Insufficient documentation

## 2011-12-01 NOTE — Telephone Encounter (Signed)
Notified pt, he had u/s this morning and is awaiting result.  Please advise.

## 2011-12-01 NOTE — Telephone Encounter (Signed)
Spoke with pt re: left cervical adenopathy. He has hx of colon CA.  Will refer to ENT for biopsy.  Pt is agreeable.

## 2011-12-02 ENCOUNTER — Ambulatory Visit: Payer: Medicare Other | Admitting: Family

## 2011-12-07 ENCOUNTER — Other Ambulatory Visit (HOSPITAL_COMMUNITY)
Admission: RE | Admit: 2011-12-07 | Discharge: 2011-12-07 | Disposition: A | Discharge status: Home / Self Care | Payer: Medicare Other | Source: Ambulatory Visit | Attending: Otolaryngology | Admitting: Otolaryngology

## 2011-12-07 ENCOUNTER — Other Ambulatory Visit: Payer: Self-pay | Admitting: Otolaryngology

## 2011-12-07 DIAGNOSIS — R220 Localized swelling, mass and lump, head: Secondary | ICD-10-CM | POA: Insufficient documentation

## 2011-12-10 ENCOUNTER — Other Ambulatory Visit (HOSPITAL_COMMUNITY): Payer: Self-pay | Admitting: Otolaryngology

## 2011-12-10 ENCOUNTER — Encounter (HOSPITAL_COMMUNITY): Payer: Self-pay | Admitting: Pharmacy Technician

## 2011-12-22 ENCOUNTER — Encounter (HOSPITAL_COMMUNITY): Payer: Self-pay

## 2011-12-22 ENCOUNTER — Encounter (HOSPITAL_COMMUNITY)
Admission: RE | Admit: 2011-12-22 | Discharge: 2011-12-22 | Disposition: A | Discharge status: Home / Self Care | Payer: Medicare Other | Source: Ambulatory Visit | Attending: Otolaryngology | Admitting: Otolaryngology

## 2011-12-22 HISTORY — DX: Unspecified osteoarthritis, unspecified site: M19.90

## 2011-12-22 LAB — BASIC METABOLIC PANEL
BUN: 19 mg/dL (ref 6–23)
Chloride: 102 mEq/L (ref 96–112)
GFR calc Af Amer: 55 mL/min — ABNORMAL LOW (ref >90)
Glucose, Bld: 83 mg/dL (ref 70–99)
Potassium: 5.1 mEq/L (ref 3.5–5.1)

## 2011-12-22 LAB — CBC
HCT: 52.1 % — ABNORMAL HIGH (ref 39.0–52.0)
Hemoglobin: 18 g/dL — ABNORMAL HIGH (ref 13.0–17.0)
MCHC: 34.5 g/dL (ref 30.0–36.0)

## 2011-12-22 LAB — SURGICAL PCR SCREEN: MRSA, PCR: NEGATIVE

## 2011-12-22 NOTE — Pre-Procedure Instructions (Signed)
20 Brandon Hensley  12/22/2011  Your procedure is scheduled on:  12-25-2011  Report to [facility] at 5:30 AM.  Call this number if you have problems the morning of surgery: [phone]   Remember:   Do not eat food or drink:After Midnight.     Take these medicines the morning of surgery with A SIP OF WATER: atenolol(Tenormin),finasteride(Proscar),tamsulosin(Flomax)   Do not wear jewelry, make-up or nail polish.  Do not wear lotions, powders, or perfumes. You may wear deodorant.  Do not shave 48 hours prior to surgery. Men may shave face and neck.  Do not bring valuables to the hospital.  Contacts, dentures or bridgework may not be worn into surgery.  Leave suitcase in the car. After surgery it may be brought to your room.  For patients admitted to the hospital, checkout time is 11:00 AM the day of discharge.   Patients discharged the day of surgery will not be allowed to drive home.  Name and phone number of your driver: ___________________________     Special Instructions: CHG Shower Use Special Wash: 1/2 bottle night before surgery and 1/2 bottle morning of surgery.     Please read over the following fact sheets that you were given: Pain Booklet, MRSA Information and Surgical Site Infection Prevention

## 2011-12-25 ENCOUNTER — Ambulatory Visit (HOSPITAL_COMMUNITY)
Admission: RE | Admit: 2011-12-25 | Discharge: 2011-12-25 | Disposition: A | Discharge status: Home / Self Care | Payer: Medicare Other | Source: Ambulatory Visit | Attending: Otolaryngology | Admitting: Otolaryngology

## 2011-12-25 ENCOUNTER — Encounter (HOSPITAL_COMMUNITY)
Admission: RE | Disposition: A | Discharge status: Home / Self Care | Payer: Self-pay | Source: Ambulatory Visit | Attending: Otolaryngology

## 2011-12-25 ENCOUNTER — Ambulatory Visit (HOSPITAL_COMMUNITY): Payer: Medicare Other | Admitting: Anesthesiology

## 2011-12-25 ENCOUNTER — Encounter (HOSPITAL_COMMUNITY): Payer: Self-pay | Admitting: Anesthesiology

## 2011-12-25 DIAGNOSIS — Z01812 Encounter for preprocedural laboratory examination: Secondary | ICD-10-CM | POA: Insufficient documentation

## 2011-12-25 DIAGNOSIS — C770 Secondary and unspecified malignant neoplasm of lymph nodes of head, face and neck: Secondary | ICD-10-CM | POA: Insufficient documentation

## 2011-12-25 DIAGNOSIS — R51 Headache: Secondary | ICD-10-CM | POA: Insufficient documentation

## 2011-12-25 DIAGNOSIS — I10 Essential (primary) hypertension: Secondary | ICD-10-CM | POA: Insufficient documentation

## 2011-12-25 DIAGNOSIS — Z85038 Personal history of other malignant neoplasm of large intestine: Secondary | ICD-10-CM | POA: Insufficient documentation

## 2011-12-25 DIAGNOSIS — Z01810 Encounter for preprocedural cardiovascular examination: Secondary | ICD-10-CM | POA: Insufficient documentation

## 2011-12-25 HISTORY — PX: MASS BIOPSY: SHX5445

## 2011-12-25 SURGERY — BIOPSY, MASS, NECK
Anesthesia: General | Site: Neck | Laterality: Left | Wound class: Clean

## 2011-12-25 MED ORDER — DEXTROSE 5 % IV SOLN
INTRAVENOUS | Status: DC | PRN
  Administered 2011-12-25: 08:00:00 via INTRAVENOUS

## 2011-12-25 MED ORDER — CEFAZOLIN SODIUM-DEXTROSE 2-3 GM-% IV SOLR
2.0000 g | Freq: Once | INTRAVENOUS | Status: AC
  Administered 2011-12-25: 2 g via INTRAVENOUS

## 2011-12-25 MED ORDER — BACITRACIN ZINC 500 UNIT/GM EX OINT
TOPICAL_OINTMENT | CUTANEOUS | Status: AC
  Filled 2011-12-25: qty 15

## 2011-12-25 MED ORDER — LIDOCAINE-EPINEPHRINE 1 %-1:100000 IJ SOLN
INTRAMUSCULAR | Status: AC
  Filled 2011-12-25: qty 2

## 2011-12-25 MED ORDER — ONDANSETRON HCL 4 MG/2ML IJ SOLN
INTRAMUSCULAR | Status: DC | PRN
  Administered 2011-12-25: 4 mg via INTRAVENOUS

## 2011-12-25 MED ORDER — HYDROMORPHONE HCL PF 1 MG/ML IJ SOLN: 0.2500 mg | INTRAMUSCULAR | Status: DC | PRN

## 2011-12-25 MED ORDER — PHENYLEPHRINE HCL 10 MG/ML IJ SOLN
INTRAMUSCULAR | Status: DC | PRN
  Administered 2011-12-25 (×2): 40 ug via INTRAVENOUS
  Administered 2011-12-25 (×3): 80 ug via INTRAVENOUS

## 2011-12-25 MED ORDER — LIDOCAINE-EPINEPHRINE 1 %-1:100000 IJ SOLN
INTRAMUSCULAR | Status: DC | PRN
  Administered 2011-12-25: 10 mL

## 2011-12-25 MED ORDER — LIDOCAINE HCL (CARDIAC) 20 MG/ML IV SOLN
INTRAVENOUS | Status: DC | PRN
  Administered 2011-12-25: 100 mg via INTRAVENOUS

## 2011-12-25 MED ORDER — SUCCINYLCHOLINE CHLORIDE 20 MG/ML IJ SOLN
INTRAMUSCULAR | Status: DC | PRN
  Administered 2011-12-25: 140 mg via INTRAVENOUS

## 2011-12-25 MED ORDER — FENTANYL CITRATE 0.05 MG/ML IJ SOLN
INTRAMUSCULAR | Status: DC | PRN
  Administered 2011-12-25 (×2): 50 ug via INTRAVENOUS

## 2011-12-25 MED ORDER — EPHEDRINE SULFATE 50 MG/ML IJ SOLN
INTRAMUSCULAR | Status: DC | PRN
  Administered 2011-12-25: 10 mg via INTRAVENOUS
  Administered 2011-12-25: 5 mg via INTRAVENOUS
  Administered 2011-12-25 (×2): 10 mg via INTRAVENOUS
  Administered 2011-12-25: 5 mg via INTRAVENOUS

## 2011-12-25 MED ORDER — BACITRACIN ZINC 500 UNIT/GM EX OINT
TOPICAL_OINTMENT | CUTANEOUS | Status: DC | PRN
  Administered 2011-12-25: 1 via TOPICAL

## 2011-12-25 MED ORDER — DEXAMETHASONE SODIUM PHOSPHATE 10 MG/ML IJ SOLN
10.0000 mg | Freq: Once | INTRAMUSCULAR | Status: AC
  Administered 2011-12-25: 10 mg via INTRAVENOUS

## 2011-12-25 MED ORDER — PROPOFOL 10 MG/ML IV EMUL
INTRAVENOUS | Status: DC | PRN
  Administered 2011-12-25: 200 mg via INTRAVENOUS

## 2011-12-25 MED ORDER — 0.9 % SODIUM CHLORIDE (POUR BTL) OPTIME
TOPICAL | Status: DC | PRN
  Administered 2011-12-25: 1000 mL

## 2011-12-25 MED ORDER — CEFAZOLIN SODIUM-DEXTROSE 2-3 GM-% IV SOLR
INTRAVENOUS | Status: AC
  Filled 2011-12-25: qty 50

## 2011-12-25 MED ORDER — ONDANSETRON HCL 4 MG/2ML IJ SOLN: 4.0000 mg | Freq: Once | INTRAMUSCULAR | Status: DC | PRN

## 2011-12-25 MED ORDER — LACTATED RINGERS IV SOLN
INTRAVENOUS | Status: DC | PRN
  Administered 2011-12-25 (×2): via INTRAVENOUS

## 2011-12-25 MED ORDER — MIDAZOLAM HCL 5 MG/5ML IJ SOLN
INTRAMUSCULAR | Status: DC | PRN
  Administered 2011-12-25: 1 mg via INTRAVENOUS

## 2011-12-25 SURGICAL SUPPLY — 55 items
AIRSTRIP 4 3/4X3 1/4 7185 (GAUZE/BANDAGES/DRESSINGS) IMPLANT
ATTRACTOMAT 16X20 MAGNETIC DRP (DRAPES) IMPLANT
BANDAGE CONFORM 2  STR LF (GAUZE/BANDAGES/DRESSINGS) IMPLANT
BANDAGE GAUZE ELAST BULKY 4 IN (GAUZE/BANDAGES/DRESSINGS) IMPLANT
CANISTER SUCTION 2500CC (MISCELLANEOUS) IMPLANT
CATH ROBINSON RED A/P 16FR (CATHETERS) IMPLANT
CLEANER TIP ELECTROSURG 2X2 (MISCELLANEOUS) ×2 IMPLANT
CLOTH BEACON ORANGE TIMEOUT ST (SAFETY) ×2 IMPLANT
CONT SPEC 4OZ CLIKSEAL STRL BL (MISCELLANEOUS) ×2 IMPLANT
COVER SURGICAL LIGHT HANDLE (MISCELLANEOUS) ×2 IMPLANT
CRADLE DONUT ADULT HEAD (MISCELLANEOUS) ×1 IMPLANT
DECANTER SPIKE VIAL GLASS SM (MISCELLANEOUS) ×2 IMPLANT
DRAIN PENROSE 1/4X12 LTX STRL (WOUND CARE) IMPLANT
DRSG EMULSION OIL 3X3 NADH (GAUZE/BANDAGES/DRESSINGS) IMPLANT
ELECT COATED BLADE 2.86 ST (ELECTRODE) ×2 IMPLANT
ELECT NDL TIP 2.8 STRL (NEEDLE) IMPLANT
ELECT NEEDLE TIP 2.8 STRL (NEEDLE) IMPLANT
ELECT REM PT RETURN 9FT ADLT (ELECTROSURGICAL) ×2
ELECTRODE REM PT RTRN 9FT ADLT (ELECTROSURGICAL) ×1 IMPLANT
GAUZE SPONGE 4X4 16PLY XRAY LF (GAUZE/BANDAGES/DRESSINGS) IMPLANT
GLOVE BIO SURGEON STRL SZ 6.5 (GLOVE) ×2 IMPLANT
GLOVE SURG SS PI 7.5 STRL IVOR (GLOVE) ×2 IMPLANT
GOWN STRL NON-REIN LRG LVL3 (GOWN DISPOSABLE) ×5 IMPLANT
KIT BASIN OR (CUSTOM PROCEDURE TRAY) ×2 IMPLANT
KIT ROOM TURNOVER OR (KITS) ×2 IMPLANT
NDL HYPO 25GX1X1/2 BEV (NEEDLE) IMPLANT
NEEDLE HYPO 25GX1X1/2 BEV (NEEDLE) ×2 IMPLANT
NS IRRIG 1000ML POUR BTL (IV SOLUTION) ×2 IMPLANT
PAD ARMBOARD 7.5X6 YLW CONV (MISCELLANEOUS) ×4 IMPLANT
PENCIL BUTTON HOLSTER BLD 10FT (ELECTRODE) ×2 IMPLANT
SPONGE GAUZE 4X4 12PLY (GAUZE/BANDAGES/DRESSINGS) IMPLANT
SUT CHROMIC 4 0 P 3 18 (SUTURE) ×1 IMPLANT
SUT CHROMIC 5 0 P 3 (SUTURE) ×1 IMPLANT
SUT ETHILON 4 0 PS 2 18 (SUTURE) ×1 IMPLANT
SUT ETHILON 5 0 P 3 18 (SUTURE)
SUT NYLON ETHILON 5-0 P-3 1X18 (SUTURE) ×1 IMPLANT
SUT PLAIN 5 0 P 3 18 (SUTURE) ×2 IMPLANT
SUT SILK 2 0 SH CR/8 (SUTURE) ×1 IMPLANT
SUT SILK 4 0 (SUTURE)
SUT SILK 4-0 18XBRD TIE 12 (SUTURE) ×1 IMPLANT
SUT VIC AB 3-0 PS1 18 (SUTURE) ×2
SUT VIC AB 3-0 PS1 18XBRD (SUTURE) IMPLANT
SUT VICRYL 4-0 PS2 18IN ABS (SUTURE) ×2 IMPLANT
SWAB COLLECTION DEVICE MRSA (MISCELLANEOUS) IMPLANT
SYR BULB 3OZ (MISCELLANEOUS) IMPLANT
SYR BULB IRRIGATION 50ML (SYRINGE) IMPLANT
SYR TB 1ML LUER SLIP (SYRINGE) IMPLANT
SYRINGE 10CC LL (SYRINGE) ×2 IMPLANT
TOWEL OR 17X24 6PK STRL BLUE (TOWEL DISPOSABLE) ×2 IMPLANT
TOWEL OR 17X26 10 PK STRL BLUE (TOWEL DISPOSABLE) ×2 IMPLANT
TRAY ENT MC OR (CUSTOM PROCEDURE TRAY) ×2 IMPLANT
TUBE ANAEROBIC SPECIMEN COL (MISCELLANEOUS) IMPLANT
TUBE CONNECTING 12X1/4 (SUCTIONS) IMPLANT
WATER STERILE IRR 1000ML POUR (IV SOLUTION) ×2 IMPLANT
YANKAUER SUCT BULB TIP NO VENT (SUCTIONS) ×2 IMPLANT

## 2011-12-25 NOTE — Transfer of Care (Signed)
Immediate Anesthesia Transfer of Care Note  Patient: Brandon Hensley  Procedure(s) Performed: Procedure(s) (LRB): NECK MASS BIOPSY (Left)  Patient Location: PACU  Anesthesia Type: General  Level of Consciousness: awake, oriented and patient cooperative  Airway & Oxygen Therapy: Patient Spontanous Breathing and Patient connected to nasal cannula oxygen  Post-op Assessment: Report given to PACU RN, Post -op Vital signs reviewed and stable and Patient moving all extremities  Post vital signs: Reviewed and stable  Complications: No apparent anesthesia complications

## 2011-12-25 NOTE — Op Note (Signed)
DATE OF OPERATION: 12/25/11 9:01 AM Surgeon: [name] Procedure Performed: left open deep cervical mass excisional biopsy 38510-Left PREOPERATIVE DIAGNOSIS: left neck mass POSTOPERATIVE DIAGNOSIS: left neck mass   SURGEON: [name] ANESTHESIA: General endotracheal.  ESTIMATED BLOOD LOSS: minimal DRAINS: none  SPECIMENS: left level IIA neck mass fresh for lymphoma workup and pathology INDICATIONS: The patient is a 78yo with a history of left level IIA neck mass for several weeks that has not improved with antibiotics. FNA showed atypical cells but was nondiagnostic. DESCRIPTION OF OPERATION: The patient was brought into the operating room and placed on the OR table in a supine position. The patient was intubated without difficulty. The patient was prepped and draped in a sterile fashion. A left 3cm incision was made in a natural skin crease using the 15-blade. The incision was carried down through subcutaneous tissues and platysmal muscle with the use of electrocautery. The left level IIA neck mass was palpated just anterior to the left SCM, and the Bovie and Arboriculturist were used to dissect the left SCM off of the mass. The ansa cervicalis was reflected anteriorly, the SCM and spinal accessory nerve were retracted posteriorly, and the left marginal mandibular branch of the facial nerve was retracted superiorly with the platysma. All of these nerves were preserved and noted to be intact throughout. The mass was clamped using an Allis clamp, and circumferentially dissected out of the surrounding tissues using the Bovie and DeBakey. The mass was tied off on its deep side using a hemostat and 2-0 silk suture ligature and removed in its entirety and passed of to be sent for pathology. The wound was irrigated and hemostasis was obtained using the Bovie. The platysma was closed with deep 3-0 vicryl sutures, and the skin was closed with a running, locking 5-0 plain gut suture. The procedure  was then ended with the patient tolerating the procedure well with no immediate complications. The patient was transported to the recovery room in good condition.    Dr. [name] was present and performed the entire procedure. 12/25/11 9:00 AM [name]

## 2011-12-25 NOTE — Preoperative (Signed)
Beta Blockers   Reason not to administer Beta Blockers:Tenormin 0330 today

## 2011-12-25 NOTE — H&P (Signed)
12/25/11 7:09 AM  Brandon Hensley  PREOPERATIVE HISTORY AND PHYSICAL  CHIEF COMPLAINT: left level IIa lymphadenopathy  HISTORY: This is a 78 year old M who presents with several weeks of left level IIA cervical adenopathy. He does have a history of colon cancer. FNA in the office showed atypical cells but was nondiagnostic/inconclusive. He now presents for left open cervical node biopsy.  Dr. Emeline Darling, [place] has discussed the risks (including bleeding, airway compromise, hematoma, infection, injury to CN IX and XI), benefits, and alternatives of this procedure. The patient understands the risks and would like to proceed with the procedure. The chances of success of the procedure are >50% and the patient understands this. I personally performed an examination of the patient within 24 hours of the procedure.  PAST MEDICAL HISTORY: Past Medical History  Diagnosis Date  . Hypertension   . Colon polyp   . BPH (benign prostatic hypertrophy)   . Polycythemia   . Kidney stones   . Generalized headaches   . Cancer     colon  . Arthritis     PAST SURGICAL HISTORY: Past Surgical History  Procedure Date  . Colon surgery 2005    colon cancer  . Hand surgery 2009    left hand, ?carpal tunnel release  . Hernia repair 2005    ?inguinal   times 3    MEDICATIONS: No current facility-administered medications on file prior to encounter.   Current Outpatient Prescriptions on File Prior to Encounter  Medication Sig Dispense Refill  . aspirin 325 MG tablet Take 325 mg by mouth daily.        Marland Kitchen atenolol (TENORMIN) 25 MG tablet Take 25 mg by mouth daily.      . cholecalciferol (VITAMIN D) 1000 UNITS tablet Take 1,000 Units by mouth daily.        . fish oil-omega-3 fatty acids 1000 MG capsule Take 1 g by mouth daily.        . Tamsulosin HCl (FLOMAX) 0.4 MG CAPS Take 0.4 mg by mouth daily.         ALLERGIES: No Known Allergies    SOCIAL HISTORY: History   Social History  . Marital Status:  Married    Spouse Name: N/A    Number of Children: 2  . Years of Education: N/A   Occupational History  . Not on file.   Social History Main Topics  . Smoking status: Never Smoker   . Smokeless tobacco: Never Used  . Alcohol Use: Yes     RARELY  . Drug Use: No  . Sexually Active: Not on file   Other Topics Concern  . Not on file   Social History Narrative   CAFFEINE USE:  1 cup coffee dailyRegular exercise:  5 x weeklyMarried for 1 year to a woman who is 69.  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     FAMILY HISTORY: Family History  Problem Relation Age of Onset  . Heart disease Mother     due to rheumatic fever    REVIEW OF SYSTEMS:  Left neck mass, otherwise negative x the balance of 10 systems except per HPI   PHYSICAL EXAM:  GENERAL:  Awake, alert VITAL SIGNS:   Filed Vitals:   12/25/11 0559  BP: 158/85  Pulse: 51  Temp: 98.6 F (37 C)  Resp: 18   SKIN:  Warm, dry HEENT:  Oral cavity clear, no oral or oropharyngeal masses or lesions seen NECK/LYMPH: :  2-3cm  palpable nontender left level IIA neck mass is palpated anterior to the SCM  LUNGS:  Grossly clear CARDIOVASCULAR:  RRR ABDOMEN:  Soft, NT MUSCULOSKELETAL: normal strength PSYCH:  normal NEUROLOGIC:  CN 2-12 intact and symmetric  DIAGNOSTIC STUDIES: neck ultrasound shows no right sided LAD, shows several left level IIA nodes with a dominant left level IIA 2-3cm mass. Chest Xray shows no masses  ASSESSMENT AND PLAN: Plan to proceed with left level IIA open node biopsy. Patient understands the risks, benefits, and alternatives.  12/25/11 7:09 AM Brandon Hensley

## 2011-12-25 NOTE — Anesthesia Postprocedure Evaluation (Signed)
Anesthesia Post Note  Patient: Brandon Hensley  Procedure(s) Performed: Procedure(s) (LRB): NECK MASS BIOPSY (Left)  Anesthesia type: general  Patient location: PACU  Post pain: Pain level controlled  Post assessment: Patient's Cardiovascular Status Stable  Last Vitals:  Filed Vitals:   12/25/11 1030  BP:   Pulse:   Temp: 37.1 C  Resp:     Post vital signs: Reviewed and stable  Level of consciousness: sedated  Complications: No apparent anesthesia complications

## 2011-12-25 NOTE — Anesthesia Procedure Notes (Signed)
Procedure Name: Intubation Date/Time: 12/25/2011 7:49 AM Performed by: Marni Griffon Pre-anesthesia Checklist: Patient identified, Emergency Drugs available, Suction available and Patient being monitored Patient Re-evaluated:Patient Re-evaluated prior to inductionOxygen Delivery Method: Circle system utilized Preoxygenation: Pre-oxygenation with 100% oxygen Intubation Type: IV induction Ventilation: Mask ventilation without difficulty Laryngoscope Size: Mac and 4 Grade View: Grade I Tube type: Oral Tube size: 7.5 mm Number of attempts: 1 Airway Equipment and Method: Stylet Placement Confirmation: ETT inserted through vocal cords under direct vision,  positive ETCO2 and breath sounds checked- equal and bilateral Secured at: 22 (cm at teeth) cm Tube secured with: Tape Dental Injury: Teeth and Oropharynx as per pre-operative assessment

## 2011-12-25 NOTE — Anesthesia Preprocedure Evaluation (Addendum)
Anesthesia Evaluation  Patient identified by MRN, date of birth, ID band Patient awake    Reviewed: Allergy & Precautions, H&P , NPO status , Patient's Chart, lab work & pertinent test results, reviewed documented beta blocker date and time   Airway Mallampati: II TM Distance: >3 FB Neck ROM: Full    Dental  (+) Caps, Teeth Intact and Dental Advisory Given   Pulmonary          Cardiovascular hypertension, Pt. on home beta blockers and Pt. on medications     Neuro/Psych  Headaches,    GI/Hepatic   Endo/Other    Renal/GU Renal disease     Musculoskeletal   Abdominal   Peds  Hematology   Anesthesia Other Findings Crown and bridge units top right front  Reproductive/Obstetrics                         Anesthesia Physical Anesthesia Plan  ASA: II  Anesthesia Plan: General   Post-op Pain Management:    Induction: Intravenous  Airway Management Planned: Oral ETT  Additional Equipment:   Intra-op Plan:   Post-operative Plan: Extubation in OR  Informed Consent: I have reviewed the patients History and Physical, chart, labs and discussed the procedure including the risks, benefits and alternatives for the proposed anesthesia with the patient or authorized representative who has indicated his/her understanding and acceptance.     Plan Discussed with: CRNA and Surgeon  Anesthesia Plan Comments:         Anesthesia Quick Evaluation

## 2011-12-29 ENCOUNTER — Encounter (HOSPITAL_COMMUNITY): Payer: Self-pay | Admitting: Otolaryngology

## 2011-12-31 ENCOUNTER — Encounter (HOSPITAL_COMMUNITY): Payer: Self-pay

## 2012-01-01 ENCOUNTER — Telehealth: Payer: Self-pay | Admitting: Family

## 2012-01-01 NOTE — Telephone Encounter (Signed)
Spoke with pt.  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  Asked him to call me if he has any questions or concerns. Pt verbalizes understanding.

## 2012-01-01 NOTE — Telephone Encounter (Signed)
Patient returned phone call. Best # [phone]

## 2012-01-01 NOTE — Telephone Encounter (Signed)
Left message requesting that pt return our call.  

## 2012-01-13 DIAGNOSIS — Z85038 Personal history of other malignant neoplasm of large intestine: Secondary | ICD-10-CM | POA: Insufficient documentation

## 2012-02-04 DIAGNOSIS — C760 Malignant neoplasm of head, face and neck: Secondary | ICD-10-CM | POA: Insufficient documentation

## 2012-02-08 DIAGNOSIS — I10 Essential (primary) hypertension: Secondary | ICD-10-CM | POA: Insufficient documentation

## 2012-02-10 ENCOUNTER — Ambulatory Visit: Payer: Medicare Other | Admitting: Family

## 2012-02-25 ENCOUNTER — Other Ambulatory Visit: Payer: Self-pay | Admitting: *Deleted

## 2012-02-25 NOTE — Telephone Encounter (Signed)
OK to send one month of each with 2 refills pls.

## 2012-02-25 NOTE — Telephone Encounter (Signed)
Pt called requesting rx refill for Flomax and finasteride. Please advise.

## 2012-02-26 MED ORDER — TAMSULOSIN HCL 0.4 MG PO CAPS: 0.4000 mg | ORAL_CAPSULE | Freq: Every day | ORAL | Status: DC

## 2012-02-26 MED ORDER — FINASTERIDE 5 MG PO TABS: 5.0000 mg | ORAL_TABLET | Freq: Every day | ORAL | Status: DC

## 2012-03-10 DIAGNOSIS — C01 Malignant neoplasm of base of tongue: Secondary | ICD-10-CM | POA: Insufficient documentation

## 2012-03-11 DIAGNOSIS — C01 Malignant neoplasm of base of tongue: Secondary | ICD-10-CM | POA: Insufficient documentation

## 2012-03-22 ENCOUNTER — Encounter: Payer: Self-pay | Admitting: Family

## 2012-03-22 DIAGNOSIS — C4442 Squamous cell carcinoma of skin of scalp and neck: Secondary | ICD-10-CM | POA: Insufficient documentation

## 2012-06-02 ENCOUNTER — Other Ambulatory Visit: Payer: Self-pay | Admitting: *Deleted

## 2012-06-02 MED ORDER — TAMSULOSIN HCL 0.4 MG PO CAPS: 0.4000 mg | ORAL_CAPSULE | Freq: Every day | ORAL | Status: DC

## 2012-06-02 NOTE — Telephone Encounter (Signed)
Received message from pt requesting refill of flomax. Rx sent. Please advise when pt should follow up with [place] again as he has no future appts on file and was last seen 08/2011.

## 2012-06-02 NOTE — Telephone Encounter (Addendum)
Ok to give 30 day supply.  Needs follow up please.

## 2012-06-03 NOTE — Telephone Encounter (Signed)
Please call pt to arrange appt. 

## 2012-06-06 NOTE — Telephone Encounter (Signed)
Informed patient of medication refill and patient scheduled an appointment for 06/21/12

## 2012-06-18 ENCOUNTER — Other Ambulatory Visit: Payer: Self-pay

## 2012-06-21 ENCOUNTER — Ambulatory Visit (INDEPENDENT_AMBULATORY_CARE_PROVIDER_SITE_OTHER): Payer: Medicare Other | Admitting: Family

## 2012-06-21 ENCOUNTER — Encounter: Payer: Self-pay | Admitting: Family

## 2012-06-21 VITALS — BP 100/74 | HR 75 | Temp 98.4°F | Resp 16 | Ht 68.5 in | Wt 188.1 lb

## 2012-06-21 DIAGNOSIS — C760 Malignant neoplasm of head, face and neck: Secondary | ICD-10-CM

## 2012-06-21 DIAGNOSIS — I10 Essential (primary) hypertension: Secondary | ICD-10-CM

## 2012-06-21 DIAGNOSIS — C4442 Squamous cell carcinoma of skin of scalp and neck: Secondary | ICD-10-CM

## 2012-06-21 NOTE — Patient Instructions (Addendum)
Please schedule a follow up appointment in 3 months.

## 2012-06-21 NOTE — Progress Notes (Signed)
Subjective:    Patient ID: Brandon Hensley, male    DOB: 02/22/1927, 79 y.o.   MRN: [id number]  HPI  Brandon Hensley is an 79 yr old male who presents today for follow up.  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  Now undergoing radiation treatment.  Reports that he has a lot of pain with eating and drinking which is improved by pre-medication prior to meals with pain medication.   2) HTN-  He denies swelling, chest pain, or SOB.  Continues atenolol.   Review of Systems See HPI  Past Medical History  Diagnosis Date  . Hypertension   . Colon polyp   . BPH (benign prostatic hypertrophy)   . Polycythemia   . Kidney stones   . Generalized headaches   . Cancer     colon  . Arthritis     History   Social History  . Marital Status: Married    Spouse Name: N/A    Number of Children: 2  . Years of Education: N/A   Occupational History  . Not on file.   Social History Main Topics  . Smoking status: Never Smoker   . Smokeless tobacco: Never Used  . Alcohol Use: Yes     Comment: RARELY  . Drug Use: No  . Sexually Active: Not on file   Other Topics Concern  . Not on file   Social History Narrative   CAFFEINE USE:  1 cup coffee daily   Regular exercise:  5 x weekly   Married for 1 year to a woman who is 86.     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           Past Surgical History  Procedure Laterality Date  . Colon surgery  2005    colon cancer  . Hand surgery  2009    left hand, ?carpal tunnel release  . Hernia repair  2005    ?inguinal   times 3  . Mass biopsy  12/25/2011    Procedure: NECK MASS BIOPSY;  Surgeon: [name], MD;  Location: [facility] OR;  Service: ENT;  Laterality: Left;  Left open neck BX    Family History  Problem Relation Age of Onset  . Heart disease Mother     due to rheumatic fever    No Known Allergies  Current Outpatient Prescriptions on File Prior to  Visit  Medication Sig Dispense Refill  . atenolol (TENORMIN) 25 MG tablet Take 25 mg by mouth daily.      . finasteride (PROSCAR) 5 MG tablet Take 1 tablet (5 mg total) by mouth daily.  30 tablet  2  . Tamsulosin HCl (FLOMAX) 0.4 MG CAPS Take 1 capsule (0.4 mg total) by mouth daily.  30 capsule  2  . aspirin 325 MG tablet Take 325 mg by mouth daily.        . cholecalciferol (VITAMIN D) 1000 UNITS tablet Take 1,000 Units by mouth daily.        . fish oil-omega-3 fatty acids 1000 MG capsule Take 1 g by mouth daily.         No current facility-administered medications on file prior to visit.    BP 100/74  Pulse 75  Temp(Src) 98.4 F (36.9 C) (Oral)  Resp 16  Ht 5' 8.5" (1.74 m)  Wt 188 lb 1.3 oz (85.313 kg)  BMI 28.18 kg/m2  SpO2  97%       Objective:   Physical Exam  Constitutional: He is oriented to person, place, and time. He appears well-developed and well-nourished. No distress.  HENT:  Head: Normocephalic and atraumatic.  + erythema and skin thickening noted left neck.  Oropharynx is erythematous, slightly swollen and the upper layer of membrane appears to be peeling.   Cardiovascular: Normal rate and regular rhythm.   No murmur heard. Pulmonary/Chest: Effort normal and breath sounds normal. No respiratory distress. He has no wheezes. He has no rales. He exhibits no tenderness.  Musculoskeletal: He exhibits no edema.  Neurological: He is alert and oriented to person, place, and time.  Psychiatric: He has a normal mood and affect. His behavior is normal. Judgment and thought content normal.          Assessment & Plan:

## 2012-06-26 NOTE — Assessment & Plan Note (Signed)
BP Readings from Last 3 Encounters:  06/21/12 100/74  12/25/11 136/85  12/25/11 136/85   BP is a little on the low side.  Pt is asymptomatic. Will plan to continue current dose of atenolol.  If BP remains low next visit, plan to cut back dose.

## 2012-06-26 NOTE — Assessment & Plan Note (Signed)
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  Primary tumor left tongue base.  Follows at [facility]- Dr. Corey Skains.  He is s/p local resection and is now undergoing local radiation therapy.  He is having a hard time with this due to the discomfort but tells me that he is determined "to beat this."  Support provided.

## 2012-06-30 ENCOUNTER — Telehealth: Payer: Self-pay | Admitting: Family

## 2012-06-30 NOTE — Telephone Encounter (Signed)
Patient left message on nurse voicemail at 1:10pm stating that he needs a refill of oxycodone and atenolol to be sent to [facility] pharmacy on [address]. He states that he has run out of medication and need these filled by tomorrow.

## 2012-07-01 MED ORDER — ATENOLOL 25 MG PO TABS: 25.0000 mg | ORAL_TABLET | Freq: Every day | ORAL | Status: DC

## 2012-07-01 NOTE — Telephone Encounter (Signed)
Atenolol sent, #30 x 3 refills. Notified pt that we will be unable to send refill on Oxycodone as we have never prescribed this for pt before. He voices understanding.

## 2012-07-04 ENCOUNTER — Telehealth: Payer: Self-pay | Admitting: Family

## 2012-07-04 MED ORDER — FINASTERIDE 5 MG PO TABS: 5.0000 mg | ORAL_TABLET | Freq: Every day | ORAL | Status: DC

## 2012-07-04 NOTE — Telephone Encounter (Signed)
Refill sent.

## 2012-07-04 NOTE — Telephone Encounter (Signed)
Finasteride 5 mg qty 30

## 2012-09-02 ENCOUNTER — Telehealth: Payer: Self-pay | Admitting: Family

## 2012-09-02 MED ORDER — TAMSULOSIN HCL 0.4 MG PO CAPS: 0.4000 mg | ORAL_CAPSULE | Freq: Every day | ORAL | Status: DC

## 2012-09-02 NOTE — Telephone Encounter (Signed)
Pt is calling to request a refill of Flomax.  [facility] uses [facility] on [facility].  OFFICE PLEASE FOLLOW UP WITH PT.

## 2012-09-02 NOTE — Telephone Encounter (Signed)
RX sent and pt informed and voiced understanding

## 2012-09-21 ENCOUNTER — Encounter: Payer: Self-pay | Admitting: Family

## 2012-09-21 ENCOUNTER — Ambulatory Visit (INDEPENDENT_AMBULATORY_CARE_PROVIDER_SITE_OTHER): Payer: Medicare Other | Admitting: Family

## 2012-09-21 VITALS — BP 100/82 | HR 61 | Temp 98.2°F | Resp 16 | Ht 68.5 in | Wt 180.0 lb

## 2012-09-21 DIAGNOSIS — C760 Malignant neoplasm of head, face and neck: Secondary | ICD-10-CM

## 2012-09-21 DIAGNOSIS — N400 Enlarged prostate without lower urinary tract symptoms: Secondary | ICD-10-CM

## 2012-09-21 DIAGNOSIS — I10 Essential (primary) hypertension: Secondary | ICD-10-CM

## 2012-09-21 DIAGNOSIS — C4442 Squamous cell carcinoma of skin of scalp and neck: Secondary | ICD-10-CM

## 2012-09-21 NOTE — Progress Notes (Signed)
Subjective:    Patient ID: Brandon Hensley, male    DOB: 12/05/1926, 79 y.o.   MRN: [id number]  HPI  1) HTN-  He is maintained on atenolol.  2) BPH- he continues proscar and flomax. Denies current voiding issues.    3) Squamous Cell carcinoma of the tongue- s/p local resection and radiation which he has completed. Still very uncomfortable.  Has little taste.  He reports dry mouth at all times.  Wakes up at night with extreme dry mouth.  He has lost weight.   Review of Systems    see HPI  Past Medical History  Diagnosis Date  . Hypertension   . Colon polyp   . BPH (benign prostatic hypertrophy)   . Polycythemia   . Kidney stones   . Generalized headaches   . Cancer     colon  . Arthritis     History   Social History  . Marital Status: Married    Spouse Name: N/A    Number of Children: 2  . Years of Education: N/A   Occupational History  . Not on file.   Social History Main Topics  . Smoking status: Never Smoker   . Smokeless tobacco: Never Used  . Alcohol Use: Yes     Comment: RARELY  . Drug Use: No  . Sexually Active: Not on file   Other Topics Concern  . Not on file   Social History Narrative   CAFFEINE USE:  1 cup coffee daily   Regular exercise:  5 x weekly   Married for 1 year to a woman who is 72.     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           Past Surgical History  Procedure Laterality Date  . Colon surgery  2005    colon cancer  . Hand surgery  2009    left hand, ?carpal tunnel release  . Hernia repair  2005    ?inguinal   times 3  . Mass biopsy  12/25/2011    Procedure: NECK MASS BIOPSY;  Surgeon: [name], MD;  Location: [facility] OR;  Service: ENT;  Laterality: Left;  Left open neck BX    Family History  Problem Relation Age of Onset  . Heart disease Mother     due to rheumatic fever    No Known Allergies  Current Outpatient Prescriptions on File Prior to Visit  Medication Sig Dispense  Refill  . atenolol (TENORMIN) 25 MG tablet Take 1 tablet (25 mg total) by mouth daily.  30 tablet  3  . finasteride (PROSCAR) 5 MG tablet Take 1 tablet (5 mg total) by mouth daily.  30 tablet  2  . tamsulosin (FLOMAX) 0.4 MG CAPS Take 1 capsule (0.4 mg total) by mouth daily.  30 capsule  2   No current facility-administered medications on file prior to visit.    BP 100/82  Pulse 61  Temp(Src) 98.2 F (36.8 C) (Oral)  Resp 16  Ht 5' 8.5" (1.74 m)  Wt 180 lb (81.647 kg)  BMI 26.97 kg/m2  SpO2 97%    Objective:   Physical Exam  Constitutional: He is oriented to person, place, and time. He appears well-developed and well-nourished. No distress.  HENT:  Head: Normocephalic and atraumatic.  Neck:  + swelling, + left sided induration.  Cardiovascular: Normal rate and regular rhythm.   No murmur heard. Pulmonary/Chest: Effort normal and breath sounds  normal. No respiratory distress. He has no wheezes. He has no rales. He exhibits no tenderness.  Musculoskeletal: He exhibits no edema.  Lymphadenopathy:    He has no cervical adenopathy.  Neurological: He is alert and oriented to person, place, and time.  Skin: Skin is warm and dry. No rash noted. No erythema. No pallor.  Psychiatric: He has a normal mood and affect. His behavior is normal. Judgment and thought content normal.          Assessment & Plan:

## 2012-09-21 NOTE — Assessment & Plan Note (Signed)
Stable on proscar, flomax, continue same.

## 2012-09-21 NOTE — Assessment & Plan Note (Signed)
He is very discouraged by the discomfort from the radiation.  Support provided.  Pt will make a follow up appointment with Dr. Park Breed.

## 2012-09-21 NOTE — Patient Instructions (Addendum)
Cut atenolol in half and take 1/2 tab daily  For 1 week then stop. Follow up in 2 weeks for a blood pressure check.

## 2012-09-21 NOTE — Assessment & Plan Note (Signed)
Overtreated- likely due to weight loss. Will cut atenolol in half x 1 week, then stop. Follow up in 2 weeks for BP recheck.  Recommended addition of asa 81mg  daily for cardiac/stroke prevention.

## 2012-09-30 ENCOUNTER — Other Ambulatory Visit: Payer: Self-pay | Admitting: Emergency Medicine

## 2012-09-30 ENCOUNTER — Telehealth: Payer: Self-pay | Admitting: Oncology

## 2012-09-30 DIAGNOSIS — D751 Secondary polycythemia: Secondary | ICD-10-CM

## 2012-09-30 DIAGNOSIS — Z85038 Personal history of other malignant neoplasm of large intestine: Secondary | ICD-10-CM

## 2012-10-03 ENCOUNTER — Other Ambulatory Visit (HOSPITAL_BASED_OUTPATIENT_CLINIC_OR_DEPARTMENT_OTHER): Payer: Medicare Other | Admitting: Lab

## 2012-10-03 ENCOUNTER — Ambulatory Visit (HOSPITAL_BASED_OUTPATIENT_CLINIC_OR_DEPARTMENT_OTHER): Payer: Medicare Other | Admitting: Oncology

## 2012-10-03 ENCOUNTER — Encounter: Payer: Self-pay | Admitting: Oncology

## 2012-10-03 ENCOUNTER — Telehealth: Payer: Self-pay | Admitting: Oncology

## 2012-10-03 VITALS — BP 137/92 | HR 86 | Temp 97.9°F | Resp 20 | Ht 68.5 in | Wt 180.4 lb

## 2012-10-03 DIAGNOSIS — D751 Secondary polycythemia: Secondary | ICD-10-CM

## 2012-10-03 DIAGNOSIS — K117 Disturbances of salivary secretion: Secondary | ICD-10-CM

## 2012-10-03 DIAGNOSIS — C760 Malignant neoplasm of head, face and neck: Secondary | ICD-10-CM

## 2012-10-03 DIAGNOSIS — Z85038 Personal history of other malignant neoplasm of large intestine: Secondary | ICD-10-CM

## 2012-10-03 LAB — COMPREHENSIVE METABOLIC PANEL (CC13)
Albumin: 3.7 g/dL (ref 3.5–5.0)
BUN: 11.5 mg/dL (ref 7.0–26.0)
CO2: 30 mEq/L — ABNORMAL HIGH (ref 22–29)
Calcium: 9.5 mg/dL (ref 8.4–10.4)
Chloride: 102 mEq/L (ref 98–107)
Glucose: 105 mg/dl — ABNORMAL HIGH (ref 70–99)
Potassium: 4.2 mEq/L (ref 3.5–5.1)
Sodium: 140 mEq/L (ref 136–145)
Total Protein: 6.8 g/dL (ref 6.4–8.3)

## 2012-10-03 LAB — CBC WITH DIFFERENTIAL/PLATELET
BASO%: 0.6 % (ref 0.0–2.0)
Basophils Absolute: 0 10*3/uL (ref 0.0–0.1)
EOS%: 3 % (ref 0.0–7.0)
HCT: 49 % (ref 38.4–49.9)
HGB: 16.8 g/dL (ref 13.0–17.1)
MCH: 33.4 pg (ref 27.2–33.4)
MCHC: 34.3 g/dL (ref 32.0–36.0)
MCV: 97.5 fL (ref 79.3–98.0)
MONO%: 11.7 % (ref 0.0–14.0)
NEUT%: 66.6 % (ref 39.0–75.0)

## 2012-10-03 NOTE — Patient Instructions (Addendum)
Keep your appointments with radiation oncology in [place]  Also call [facility] for an appointment with your ENT surgeon  i will see you back in 4 months for follow up

## 2012-10-05 ENCOUNTER — Ambulatory Visit (INDEPENDENT_AMBULATORY_CARE_PROVIDER_SITE_OTHER): Payer: Medicare Other

## 2012-10-05 ENCOUNTER — Ambulatory Visit: Payer: Medicare Other

## 2012-10-05 VITALS — BP 110/72 | HR 106

## 2012-10-05 DIAGNOSIS — I10 Essential (primary) hypertension: Secondary | ICD-10-CM

## 2012-10-05 NOTE — Progress Notes (Signed)
  Subjective:    Patient ID: Brandon Hensley, male    DOB: [birth year], 79 y.o.   MRN: [id number]  HPI    Review of Systems     Objective:   Physical Exam        Assessment & Plan:  Pt came in for a BP check. Pt stopped his Atenolol 1 week ago. Results reviewed with Melissa and pt informed per Melissa to continue doing what he's doing. Pt voiced understanding

## 2012-10-24 NOTE — Progress Notes (Signed)
Hematology and Oncology Follow Up Visit  Brandon Hensley [id number] 12-20-1926 79 y.o. 10/24/2012 10:40 PM   DIAGNOSIS: 79 year old gentleman with:  1.  secondary polycythemia.  2. Stage I colon cancer  #3 squamous cell carcinoma of the head and neckstatus post neck dissection followed by radiation therapy he declined chemotherapy.  Encounter Diagnoses  Name Primary?  . Polycythemia, secondary Yes  . Iron overload      PAST THERAPY: She has had multiple phlebotomies in the past also has a history of colon cancer which was stage I diagnosed originally in November 2003 he is N ED.   Interim History:  Patient returns in followup after a significant absence. But his course has been quite complicated. Patient apparently developed And subsequently was found to have squamous cell carcinoma of the head and neck. He underwent a radical neck dissection. He then had radiation therapy at [facility]. The surgery was performed at [facility]. He now suffers significantly from xerostomia. He tells me that he feels back in seed radiation therapy. They had offered chemotherapy I assume consisting of cis-platinum but he declined. I have tried to get his records from [facility] as well as [place] [facility].  Medications: I have reviewed the patient's current medications.  Allergies: No known drug allergies.  Past Medical History, Surgical history, Social history, and Family History were reviewed and updated.  Review of Systems: Constitutional:  Negative for fever, chills, night sweats, anorexia, weight loss, pain. Cardiovascular: no chest pain or dyspnea on exertion Respiratory: no cough, shortness of breath, or wheezing Neurological: no TIA or stroke symptoms Dermatological: negative ENT: negative Skin Gastrointestinal: no abdominal pain, change in bowel habits, or black or bloody stools Genito-Urinary: no dysuria, trouble voiding, or hematuria Hematological and Lymphatic:  negative  Musculoskeletal: negative Remaining ROS negative.  Physical Exam:  Blood pressure 137/92, pulse 86, temperature 97.9 F (36.6 C), temperature source Oral, resp. rate 20, height 5' 8.5" (1.74 m), weight 180 lb 6.4 oz (81.829 kg).  ECOG: 1  Patient is awake alert appears a little bit older than I remember intensity. He is a little bit more frail. HEENT exam surgical site looks good on the size. Lungs clear to auscultation Cardiovascular regular rate rhythm Abdomen is soft Extremities no edema Neuro nonfocal   Lab Results: Lab Results  Component Value Date   WBC 6.4 10/03/2012   HGB 16.8 10/03/2012   HCT 49.0 10/03/2012   MCV 97.5 10/03/2012   PLT 140 10/03/2012     Chemistry      Component Value Date/Time   NA 140 10/03/2012 0925   NA 138 12/22/2011 1530   NA 142 07/27/2008 0841   K 4.2 10/03/2012 0925   K 5.1 12/22/2011 1530   K 4.6 07/27/2008 0841   CL 102 10/03/2012 0925   CL 102 12/22/2011 1530   CL 99 07/27/2008 0841   CO2 30* 10/03/2012 0925   CO2 28 12/22/2011 1530   CO2 29 07/27/2008 0841   BUN 11.5 10/03/2012 0925   BUN 19 12/22/2011 1530   BUN 17 07/27/2008 0841   CREATININE 1.1 10/03/2012 0925   CREATININE 1.34 12/22/2011 1530   CREATININE 1.29 11/18/2011 0844      Component Value Date/Time   CALCIUM 9.5 10/03/2012 0925   CALCIUM 9.6 12/22/2011 1530   CALCIUM 9.2 07/27/2008 0841   ALKPHOS 87 10/03/2012 0925   ALKPHOS 111 02/17/2010 2005   ALKPHOS 91* 07/27/2008 0841   AST 20 10/03/2012 0925  AST 37 02/17/2010 2005   AST 25 07/27/2008 0841   ALT 16 10/03/2012 0925   ALT 23 02/17/2010 2005   BILITOT 0.77 10/03/2012 0925   BILITOT 1.0 02/17/2010 2005   BILITOT 1.00 07/27/2008 0841       Radiological Studies:  No results found.   IMPRESSIONS AND PLAN: A 79 y.o. male with  1. Polycythemia that is secondary to possibly obstructive sleep apnea. He has received phlebotomies in the past.patient since was lost to followup.  #2 patient recently was diagnosed with squamous cell  carcinoma of the head and neck. 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 Thereafter he had radiation therapy at [facility].  #3 patient clinically seems to be doing well. He would like to reestablish is here.  #4 I have encouraged the patient to keep his appointments with radiation oncology in [facility] as well as call for this.  #5 I will see him back in 4 months time for followup.     Visit 30 minutes and face to face time 25 minutes.   [name], MD Medical/Oncology [facility] [phone] (beeper) [phone] (Office)

## 2012-11-05 ENCOUNTER — Other Ambulatory Visit: Payer: Self-pay | Admitting: Family

## 2012-12-09 ENCOUNTER — Telehealth: Payer: Self-pay | Admitting: *Deleted

## 2012-12-09 NOTE — Telephone Encounter (Signed)
Received message from pt requesting refill of flomax. Called pt, he states that pharmacy told him he had 1 refill left and they filled rx, refill not needed at this time.

## 2013-01-11 ENCOUNTER — Other Ambulatory Visit: Payer: Self-pay | Admitting: Family Medicine

## 2013-01-11 ENCOUNTER — Telehealth: Payer: Self-pay | Admitting: *Deleted

## 2013-01-11 MED ORDER — TAMSULOSIN HCL 0.4 MG PO CAPS: 0.4000 mg | ORAL_CAPSULE | Freq: Every day | ORAL | Status: DC

## 2013-01-11 NOTE — Telephone Encounter (Signed)
Left detailed message on pt's home # re: rx completion.

## 2013-01-11 NOTE — Telephone Encounter (Signed)
Received message from pt that he would like to get a 90 day supply of flomax. Rx sent to pharmacy.

## 2013-01-23 ENCOUNTER — Telehealth: Payer: Self-pay | Admitting: Oncology

## 2013-01-23 NOTE — Telephone Encounter (Signed)
, °

## 2013-01-30 ENCOUNTER — Other Ambulatory Visit: Payer: Medicare Other | Admitting: Lab

## 2013-01-30 ENCOUNTER — Ambulatory Visit: Payer: Medicare Other | Admitting: Oncology

## 2013-02-08 ENCOUNTER — Other Ambulatory Visit: Payer: Self-pay | Admitting: Family

## 2013-03-09 ENCOUNTER — Other Ambulatory Visit: Payer: Self-pay

## 2013-03-10 ENCOUNTER — Encounter: Payer: Self-pay | Admitting: Family

## 2013-03-10 ENCOUNTER — Ambulatory Visit (INDEPENDENT_AMBULATORY_CARE_PROVIDER_SITE_OTHER): Payer: Medicare Other | Admitting: Family

## 2013-03-10 VITALS — BP 110/80 | HR 100 | Temp 98.0°F | Resp 16 | Ht 68.5 in | Wt 179.1 lb

## 2013-03-10 DIAGNOSIS — C760 Malignant neoplasm of head, face and neck: Secondary | ICD-10-CM

## 2013-03-10 DIAGNOSIS — I10 Essential (primary) hypertension: Secondary | ICD-10-CM

## 2013-03-10 DIAGNOSIS — Z23 Encounter for immunization: Secondary | ICD-10-CM

## 2013-03-10 DIAGNOSIS — D751 Secondary polycythemia: Secondary | ICD-10-CM

## 2013-03-10 DIAGNOSIS — D45 Polycythemia vera: Secondary | ICD-10-CM

## 2013-03-10 DIAGNOSIS — R739 Hyperglycemia, unspecified: Secondary | ICD-10-CM

## 2013-03-10 DIAGNOSIS — R7309 Other abnormal glucose: Secondary | ICD-10-CM

## 2013-03-10 DIAGNOSIS — N400 Enlarged prostate without lower urinary tract symptoms: Secondary | ICD-10-CM

## 2013-03-10 DIAGNOSIS — D649 Anemia, unspecified: Secondary | ICD-10-CM

## 2013-03-10 DIAGNOSIS — C4442 Squamous cell carcinoma of skin of scalp and neck: Secondary | ICD-10-CM

## 2013-03-10 DIAGNOSIS — Z0000 Encounter for general adult medical examination without abnormal findings: Secondary | ICD-10-CM

## 2013-03-10 DIAGNOSIS — Z85038 Personal history of other malignant neoplasm of large intestine: Secondary | ICD-10-CM

## 2013-03-10 LAB — CBC WITH DIFFERENTIAL/PLATELET
Eosinophils Relative: 3 % (ref 0–5)
HCT: 45.8 % (ref 39.0–52.0)
Hemoglobin: 16.4 g/dL (ref 13.0–17.0)
Lymphocytes Relative: 16 % (ref 12–46)
MCV: 92 fL (ref 78.0–100.0)
Monocytes Absolute: 0.7 10*3/uL (ref 0.1–1.0)
Monocytes Relative: 12 % (ref 3–12)
Neutro Abs: 4.3 10*3/uL (ref 1.7–7.7)
WBC: 6.3 10*3/uL (ref 4.0–10.5)

## 2013-03-10 LAB — HEMOGLOBIN A1C: Mean Plasma Glucose: 103 mg/dL (ref <117)

## 2013-03-10 LAB — BASIC METABOLIC PANEL
CO2: 33 mEq/L — ABNORMAL HIGH (ref 19–32)
Chloride: 100 mEq/L (ref 96–112)
Creat: 1.08 mg/dL (ref 0.50–1.35)
Glucose, Bld: 95 mg/dL (ref 70–99)
Sodium: 139 mEq/L (ref 135–145)

## 2013-03-10 NOTE — Progress Notes (Signed)
Subjective:    Patient ID: Brandon Hensley, male    DOB: [birth year], 79 y.o.   MRN: [id number]  HPI   Subjective:   Patient here for Medicare annual wellness visit and management of other chronic and acute problems.  Hx colon cancer- Pt's last colonoscopy was in 2011  Polycythemia Vera- He follows with Dr. Park Breed.   Squamous Cell Carcinoma of the neck- reports that he has trouble eating bread due to xerostomia.    BPH- maintained on and flomax. Reports that symptoms are well controled.   Patient presents today for complete physical.  Immunizations: had flu shot last month. Due for prevnar.  Will check on coverage of zostavax. Diet: reports diet is healthy, continues a can of ensure. Exercise: reports that he uses an exercise machin every morning also does some karate.  Colonoscopy: 2011.   Declines dexa  Risk factors:  Risk due to recent cancer treatment  Roster of Physicians Providing Medical Care to Patient: Dr. Garey Ham Dr. Radiation oncology at [facility] regional.  Activities of Daily Living  In your present state of health, do you have any difficulty performing the following activities? Preparing food and eating?: No  Bathing yourself: No  Getting dressed: No  Using the toilet:No  Moving around from place to place: No  In the past year have you fallen or had a near fall?:No    Home Safety: Has smoke detector and wears seat belts. Does have guns in the home. No excess sun exposure.  Diet and Exercise  Current exercise habits:  Dietary issues discussed: healthy diet   Depression Screen  (Note: if answer to either of the following is "Yes", then a more complete depression screening is indicated)  Q1: Over the past two weeks, have you felt down, depressed or hopeless?no  Q2: Over the past two weeks, have you felt little interest or pleasure in doing things? no   The following portions of the patient's history were reviewed and updated as appropriate: allergies, current  medications, past family history, past medical history, past social history, past surgical history and problem list.   Past Medical History  Diagnosis Date  . Hypertension   . Colon polyp   . BPH (benign prostatic hypertrophy)   . Polycythemia   . Kidney stones   . Generalized headaches   . Cancer     colon  . Arthritis     History   Social History  . Marital Status: Married    Spouse Name: N/A    Number of Children: 2  . Years of Education: N/A   Occupational History  . Not on file.   Social History Main Topics  . Smoking status: Never Smoker   . Smokeless tobacco: Never Used  . Alcohol Use: Yes     Comment: RARELY  . Drug Use: No  . Sexual Activity: Yes   Other Topics Concern  . Not on file   Social History Narrative   CAFFEINE USE:  1 cup coffee daily   Regular exercise:  5 x weekly   Married for 1 year to a woman who is 37.     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           Past Surgical History  Procedure Laterality Date  . Colon surgery  2005    colon cancer  . Hand surgery  2009    left hand, ?carpal tunnel release  . Hernia repair  2005    ?inguinal   times 3  . Mass biopsy  12/25/2011    Procedure: NECK MASS BIOPSY;  Surgeon: [name], MD;  Location: [facility] OR;  Service: ENT;  Laterality: Left;  Left open neck BX    Family History  Problem Relation Age of Onset  . Heart disease Mother     due to rheumatic fever    No Known Allergies  Current Outpatient Prescriptions on File Prior to Visit  Medication Sig Dispense Refill  . aspirin EC 81 MG tablet Take 81 mg by mouth daily.      . tamsulosin (FLOMAX) 0.4 MG CAPS capsule TAKE 1 CAPSULE BY MOUTH ONCE DAILY  30 capsule  3   No current facility-administered medications on file prior to visit.    BP 110/80  Pulse 100  Temp(Src) 98 F (36.7 C) (Oral)  Resp 16  Ht 5' 8.5" (1.74 m)  Wt 179 lb 1.3 oz (81.23 kg)  BMI 26.83 kg/m2  SpO2  97%    Objective:   Vision: reports that he uses a contact in the left eye for reading and therefore has bad distance fision in the left eye Hearing:  Unable to hear forced whisper at 6 feet.  Declines referral to audiology Body mass index: see nursing Cognitive Impairment Assessment: cognition, memory and judgment appear normal.   Assessment:   Medicare wellness utd on preventive parameters  Plan:   During the course of the visit the patient was educated and counseled about appropriate screening and preventive services including:       Fall prevention   Screening mammography  Bone densitometry screening  Diabetes screening  Nutrition counseling   Vaccines / LABS Zostavax / Pnemonccoal Vaccine  today  PSA  Patient Instructions (the written plan) was given to the patient.      Review of Systems  Constitutional: Negative for unexpected weight change.       Reports insomnia due to "choking sensation" when he sleeps- dry mouth  HENT:       Denies nasal congestion  Respiratory: Positive for cough.   Cardiovascular: Negative for chest pain and palpitations.  Gastrointestinal: Negative for abdominal pain.  Genitourinary:       Reports + nocturia  Musculoskeletal: Positive for neck pain.       "arthritic neck"  Skin: Negative for rash.  Neurological: Negative for headaches.  Hematological: Negative for adenopathy.  Psychiatric/Behavioral:       Reports hx of depression.   Denies suicide ideation Reports feeling frustrated from radiation side effects         Objective:   Physical Exam  Constitutional: He is oriented to person, place, and time. He appears well-developed and well-nourished. No distress.  HENT:  Head: Normocephalic and atraumatic.  Neck:  Neck tissue is firm/indurated fromradiation treatmetn.  R>L.    Cardiovascular: Normal rate and regular rhythm.   No murmur heard. Pulmonary/Chest: Effort normal and breath sounds normal. No respiratory distress. He  has no wheezes. He has no rales. He exhibits no tenderness.  Abdominal: Soft. Bowel sounds are normal. He exhibits no distension and no mass. There is no tenderness. There is no rebound and no guarding.  Musculoskeletal: He exhibits no edema.  Neurological: He is alert and oriented to person, place, and time.  Psychiatric: He has a normal mood and affect. Judgment and thought content normal.  Irritable/mildly aggitated  today          Assessment & Plan:

## 2013-03-10 NOTE — Patient Instructions (Signed)
Please complete your lab work prior to leaving. Follow up in 6 months.  

## 2013-03-13 ENCOUNTER — Encounter: Payer: Self-pay | Admitting: Family

## 2013-03-14 DIAGNOSIS — Z0000 Encounter for general adult medical examination without abnormal findings: Secondary | ICD-10-CM | POA: Insufficient documentation

## 2013-03-14 NOTE — Assessment & Plan Note (Signed)
BP Readings from Last 3 Encounters:  03/10/13 110/80  10/05/12 110/72  10/03/12 137/92   BP stable, currenlty off of meds.

## 2013-03-14 NOTE — Assessment & Plan Note (Signed)
Last colo was in 2011.  At his age it is reasonable to discontinue colonoscopy.

## 2013-03-14 NOTE — Assessment & Plan Note (Signed)
Check PSA. ?

## 2013-03-14 NOTE — Assessment & Plan Note (Signed)
Management per hematology. 

## 2013-09-05 ENCOUNTER — Ambulatory Visit: Payer: Medicare Other | Admitting: Family

## 2013-10-06 ENCOUNTER — Ambulatory Visit (INDEPENDENT_AMBULATORY_CARE_PROVIDER_SITE_OTHER): Payer: Medicare Other | Admitting: Family

## 2013-10-06 ENCOUNTER — Telehealth: Payer: Self-pay | Admitting: Family

## 2013-10-06 ENCOUNTER — Encounter: Payer: Self-pay | Admitting: Family

## 2013-10-06 VITALS — BP 150/96 | HR 76 | Temp 98.0°F | Resp 16

## 2013-10-06 DIAGNOSIS — C4442 Squamous cell carcinoma of skin of scalp and neck: Secondary | ICD-10-CM

## 2013-10-06 DIAGNOSIS — D751 Secondary polycythemia: Secondary | ICD-10-CM

## 2013-10-06 DIAGNOSIS — E875 Hyperkalemia: Secondary | ICD-10-CM

## 2013-10-06 DIAGNOSIS — R1310 Dysphagia, unspecified: Secondary | ICD-10-CM

## 2013-10-06 DIAGNOSIS — Z0000 Encounter for general adult medical examination without abnormal findings: Secondary | ICD-10-CM

## 2013-10-06 DIAGNOSIS — C760 Malignant neoplasm of head, face and neck: Secondary | ICD-10-CM

## 2013-10-06 DIAGNOSIS — I10 Essential (primary) hypertension: Secondary | ICD-10-CM

## 2013-10-06 DIAGNOSIS — Z13820 Encounter for screening for osteoporosis: Secondary | ICD-10-CM

## 2013-10-06 DIAGNOSIS — N400 Enlarged prostate without lower urinary tract symptoms: Secondary | ICD-10-CM

## 2013-10-06 DIAGNOSIS — D45 Polycythemia vera: Secondary | ICD-10-CM

## 2013-10-06 DIAGNOSIS — H6120 Impacted cerumen, unspecified ear: Secondary | ICD-10-CM

## 2013-10-06 LAB — HEPATIC FUNCTION PANEL
ALK PHOS: 103 U/L (ref 39–117)
ALT: 13 U/L (ref 0–53)
AST: 18 U/L (ref 0–37)
Albumin: 4 g/dL (ref 3.5–5.2)
BILIRUBIN DIRECT: 0.2 mg/dL (ref 0.0–0.3)
Indirect Bilirubin: 0.4 mg/dL (ref 0.2–1.2)
Total Bilirubin: 0.6 mg/dL (ref 0.2–1.2)
Total Protein: 6.7 g/dL (ref 6.0–8.3)

## 2013-10-06 LAB — CBC WITH DIFFERENTIAL/PLATELET
BASOS PCT: 0 % (ref 0–1)
Basophils Absolute: 0 10*3/uL (ref 0.0–0.1)
Eosinophils Absolute: 0.4 10*3/uL (ref 0.0–0.7)
Eosinophils Relative: 5 % (ref 0–5)
HCT: 46.3 % (ref 39.0–52.0)
Hemoglobin: 16.3 g/dL (ref 13.0–17.0)
LYMPHS ABS: 1 10*3/uL (ref 0.7–4.0)
Lymphocytes Relative: 14 % (ref 12–46)
MCH: 32.1 pg (ref 26.0–34.0)
MCHC: 35.2 g/dL (ref 30.0–36.0)
MCV: 91.1 fL (ref 78.0–100.0)
MONO ABS: 0.9 10*3/uL (ref 0.1–1.0)
Monocytes Relative: 13 % — ABNORMAL HIGH (ref 3–12)
NEUTROS ABS: 5 10*3/uL (ref 1.7–7.7)
Neutrophils Relative %: 68 % (ref 43–77)
Platelets: 224 10*3/uL (ref 150–400)
RBC: 5.08 MIL/uL (ref 4.22–5.81)
RDW: 13.4 % (ref 11.5–15.5)
WBC: 7.3 10*3/uL (ref 4.0–10.5)

## 2013-10-06 LAB — BASIC METABOLIC PANEL
BUN: 15 mg/dL (ref 6–23)
CHLORIDE: 100 meq/L (ref 96–112)
CO2: 31 meq/L (ref 19–32)
Calcium: 9.6 mg/dL (ref 8.4–10.5)
Creat: 1.14 mg/dL (ref 0.50–1.35)
Glucose, Bld: 78 mg/dL (ref 70–99)
Potassium: 5.4 mEq/L — ABNORMAL HIGH (ref 3.5–5.3)
Sodium: 142 mEq/L (ref 135–145)

## 2013-10-06 MED ORDER — FLUTICASONE PROPIONATE 50 MCG/ACT NA SUSP: NASAL | Status: DC

## 2013-10-06 MED ORDER — TAMSULOSIN HCL 0.4 MG PO CAPS: ORAL_CAPSULE | ORAL | Status: DC

## 2013-10-06 MED ORDER — LORATADINE 10 MG PO TABS: 10.0000 mg | ORAL_TABLET | Freq: Every day | ORAL | Status: DC

## 2013-10-06 NOTE — Assessment & Plan Note (Signed)
Check follow up CBC. Arrange follow up with hematology/oncology.

## 2013-10-06 NOTE — Assessment & Plan Note (Signed)
Ceruminosis is noted.  Wax is removed by manual debridement.

## 2013-10-06 NOTE — Assessment & Plan Note (Signed)
Having dysphagia following radiation treatment. Will obtain barium swallow to further evaluate.  Xerostomia is likely secondary to radiation treatment.  I will send him back to Dr. Simeon Craft for evaluation of his throat pain.

## 2013-10-06 NOTE — Patient Instructions (Signed)
Please complete lab work prior to leaving. You will be contacted about your referral to ENT- Dr. Simeon Craft and your bone density test.  Start flonase and claritin for nasal drainage.  Both are available over the counter. Please follow up in 3 months.

## 2013-10-06 NOTE — Progress Notes (Signed)
Subjective:    Patient ID: Brandon Hensley, male    DOB: May 26, 1926, 80 y.o.   MRN: [id number]  HPI  [name] is an 80 yr old male who presents today for medicare wellness visit.  Subjective:   Patient here for Medicare annual wellness visit and management of other chronic and acute problems.   HTN-  Reports that his wife is a Marine scientist and has been checking BP's at home and that they have been well controlled.  He is not currently on an antihypertensive.  BP Readings from Last 3 Encounters:  10/06/13 150/96  03/10/13 110/80  10/05/12 110/72   BPH- on flomax- Reports no voiding issues  Surgeon at [place] visit 6 months ago.   Hx Squamous cell carcinoma of the neck.  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  Primary tumor left tongue base.  Follows at [facility].  Dr. Francina Ames.  Pt reports that his last visit with Dr. Nicolette Bang was 6 months ago. He reports that he is unhappy with his care. Did not feel that he was sympathetic.  He reports that he continues to have throat pain, mouth dryness and difficulty swallowing.  Feels that when he swallows it "goes up my nose."  He is having trouble sleeping due to difficulty breathing through is nose and subsequent dry mouth.  Wakes up unrested.    Immunizations: due for zostavax.   Diet: good Exercise: regular Colonoscopy: last colo- 2011, due for follow up colo 2016.  Dexa:  due   Risk factors: Hx Cancer  Roster of Physicians Providing Medical Care to Patient: Dr. Ruby Cola Dr. Francina Ames Dr. Consuela Mimes oncology Dr. Silvano Rusk.   Activities of Daily Living  In your present state of health, do you have any difficulty performing the following activities? Preparing food and eating?: No  Bathing yourself: No  Getting dressed: No  Using the toilet:No  Moving around from place to place: No  In the past year have you fallen or had a near fall?:No    Home Safety: Has smoke detector and wears seat belts.  No firearms. No excess sun exposure  Diet and Exercise  Current exercise habits: yes, reports that he uses machine and treadmill Dietary issues discussed: healthy diet   Depression Screen  (Note: if answer to either of the following is "Yes", then a more complete depression screening is indicated)  Q1: Over the past two weeks, have you felt down, depressed or hopeless?no  Q2: Over the past two weeks, have you felt little interest or pleasure in doing things? no   The following portions of the patient's history were reviewed and updated as appropriate: allergies, current medications, past family history, past medical history, past social history, past surgical history and problem list.   Past Medical History  Diagnosis Date  . Hypertension   . Colon polyp   . BPH (benign prostatic hypertrophy)   . Polycythemia   . Kidney stones   . Generalized headaches   . Cancer     colon  . Arthritis     History   Social History  . Marital Status: Married    Spouse Name: N/A    Number of Children: 2  . Years of Education: N/A   Occupational History  . Not on file.   Social History Main Topics  . Smoking status: Never Smoker   . Smokeless tobacco: Never Used  . Alcohol Use: Yes     Comment: RARELY  .  Drug Use: No  . Sexual Activity: Yes   Other Topics Concern  . Not on file   Social History Narrative   CAFFEINE USE:  1 cup coffee daily   Regular exercise:  5 x weekly   Married for 1 year to a woman who is 36.     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           Past Surgical History  Procedure Laterality Date  . Colon surgery  2005    colon cancer  . Hand surgery  2009    left hand, ?carpal tunnel release  . Hernia repair  2005    ?inguinal   times 3  . Mass biopsy  12/25/2011    Procedure: NECK MASS BIOPSY;  Surgeon: Ruby Cola, MD;  Location: [facility];  Service: ENT;  Laterality: Left;  Left open neck BX    Family History    Problem Relation Age of Onset  . Heart disease Mother     due to rheumatic fever    No Known Allergies  Current Outpatient Prescriptions on File Prior to Visit  Medication Sig Dispense Refill  . aspirin EC 81 MG tablet Take 81 mg by mouth daily.      . tamsulosin (FLOMAX) 0.4 MG CAPS capsule TAKE 1 CAPSULE BY MOUTH ONCE DAILY  30 capsule  3   No current facility-administered medications on file prior to visit.    BP 150/96  Pulse 76  Temp(Src) 98 F (36.7 C) (Oral)  Resp 16  SpO2 97%   Objective:   Vision:see nursing Hearing: unable to hear forced whisper at 6 feet Body mass index: 26 Cognitive Impairment Assessment: cognition, memory and judgment appear normal.   Assessment:   Medicare wellness utd on preventive parameters- except zostavax (advised pt to call his insurance to verify coverage) as well as dexa scan- ordered.  Plan:    During the course of the visit the patient was educated and counseled about appropriate screening and preventive services including:  Bone densitometry screening   Vaccines / LABS BMET, cbc, lft  Patient Instructions (the written plan) was given to the patient.     Review of Systems     Objective:   Physical Exam  Constitutional: He is oriented to person, place, and time. He appears well-developed and well-nourished. No distress.  HENT:  Head: Normocephalic and atraumatic.  Bilateral cerumen impaction noted Tongue is noted to be moist  Neck:  Left side of neck is firm   Cardiovascular: Normal rate and regular rhythm.   No murmur heard. Pulmonary/Chest: Effort normal and breath sounds normal. No respiratory distress. He has no wheezes. He has no rales. He exhibits no tenderness.  Musculoskeletal: He exhibits no edema.  Neurological: He is alert and oriented to person, place, and time.  Psychiatric: He has a normal mood and affect. His behavior is normal. Judgment and thought content normal.          Assessment & Plan:

## 2013-10-06 NOTE — Progress Notes (Signed)
Pre visit review using our clinic review tool, if applicable. No additional management support is needed unless otherwise documented below in the visit note. 

## 2013-10-06 NOTE — Assessment & Plan Note (Signed)
BP elevated today, has been stable at home per pt report. Monitor for now.

## 2013-10-06 NOTE — Telephone Encounter (Signed)
See order.

## 2013-10-06 NOTE — Assessment & Plan Note (Signed)
Stable on Flomax, continue same. 

## 2013-10-08 ENCOUNTER — Telehealth: Payer: Self-pay | Admitting: Family

## 2013-10-08 DIAGNOSIS — E875 Hyperkalemia: Secondary | ICD-10-CM

## 2013-10-08 NOTE — Telephone Encounter (Signed)
Potassium is elevated.  Please repeat bmet in 1 week, dx hyperkalemia. Liver function, blood count and kidney function are normal.

## 2013-10-09 ENCOUNTER — Telehealth: Payer: Self-pay | Admitting: Family

## 2013-10-09 NOTE — Telephone Encounter (Signed)
Relevant patient education assigned to patient using Emmi. ° °

## 2013-10-09 NOTE — Telephone Encounter (Signed)
Notified pt and entered lab order.

## 2013-10-10 ENCOUNTER — Ambulatory Visit (HOSPITAL_COMMUNITY): Payer: Medicare Other

## 2013-10-11 ENCOUNTER — Other Ambulatory Visit: Payer: Medicare Other

## 2013-10-16 LAB — BASIC METABOLIC PANEL
BUN: 23 mg/dL (ref 6–23)
CALCIUM: 9.6 mg/dL (ref 8.4–10.5)
CO2: 32 mEq/L (ref 19–32)
CREATININE: 1.07 mg/dL (ref 0.50–1.35)
Chloride: 100 mEq/L (ref 96–112)
GLUCOSE: 101 mg/dL — AB (ref 70–99)
Potassium: 4.2 mEq/L (ref 3.5–5.3)
SODIUM: 139 meq/L (ref 135–145)

## 2013-12-18 ENCOUNTER — Telehealth: Payer: Self-pay

## 2013-12-18 NOTE — Telephone Encounter (Signed)
Pt stated that " He was at the [facility] last week and they wanted the pt to have his thyroid checked. Pt already has an appt on 9/9. We would like someone to contact them to see exactly what he needs to have done." LDM

## 2013-12-18 NOTE — Telephone Encounter (Signed)
Do not see where any lab orders were placed by the [facility].  They likely just want him to see his PCP, Melissa, who will order labs.  If he already has an appointment on 9/9 he can address this with her at that visit.  If he wants to come in sooner to the lab so they can discuss results at that visit, then we can place an order for TSH, free T4 and T3 Uptake.

## 2013-12-18 NOTE — Telephone Encounter (Signed)
Would pt not be responsible for bringing orders from [facility] if they cannot do them at their lab.?/SLS Please Advise.

## 2013-12-20 NOTE — Telephone Encounter (Signed)
Patient states he will just wait until his appt w/PCP on 09.09.15 and let her order whatever labs are needed/SLS

## 2013-12-28 ENCOUNTER — Telehealth: Payer: Self-pay

## 2013-12-28 MED ORDER — ATENOLOL 25 MG PO TABS: 25.0000 mg | ORAL_TABLET | Freq: Two times a day (BID) | ORAL | Status: DC

## 2013-12-28 NOTE — Telephone Encounter (Signed)
Pt stated that " He wanted to talk about BP medication that was discontinued. He would like to start it again." LDM

## 2013-12-28 NOTE — Telephone Encounter (Signed)
Restart atenolol (rx sent) keep upcoming appointment please.

## 2013-12-28 NOTE — Telephone Encounter (Signed)
Pt stated that " he's been having symptoms of weakness, and headaches. 1 bp reading was 150/30 and 2 was 130/90. He stated that he's been taking this medication for a while and he knows that he needs it again." LDM

## 2013-12-28 NOTE — Telephone Encounter (Signed)
Have his blood pressures been elevated at home? What have his pressures been.

## 2013-12-29 NOTE — Telephone Encounter (Signed)
Left detailed message on home# and to call if any questions. 

## 2014-01-10 ENCOUNTER — Ambulatory Visit (INDEPENDENT_AMBULATORY_CARE_PROVIDER_SITE_OTHER): Payer: Medicare Other | Admitting: Family

## 2014-01-10 ENCOUNTER — Telehealth: Payer: Self-pay | Admitting: Family

## 2014-01-10 ENCOUNTER — Encounter: Payer: Self-pay | Admitting: Family

## 2014-01-10 VITALS — BP 110/70 | HR 65 | Temp 98.5°F | Resp 16 | Ht 68.5 in | Wt 187.4 lb

## 2014-01-10 DIAGNOSIS — I10 Essential (primary) hypertension: Secondary | ICD-10-CM

## 2014-01-10 DIAGNOSIS — C760 Malignant neoplasm of head, face and neck: Secondary | ICD-10-CM

## 2014-01-10 DIAGNOSIS — D751 Secondary polycythemia: Secondary | ICD-10-CM

## 2014-01-10 DIAGNOSIS — N400 Enlarged prostate without lower urinary tract symptoms: Secondary | ICD-10-CM

## 2014-01-10 DIAGNOSIS — C4442 Squamous cell carcinoma of skin of scalp and neck: Secondary | ICD-10-CM

## 2014-01-10 NOTE — Progress Notes (Signed)
Subjective:    Patient ID: Clydene Pugh, male    DOB: [birth year], 80 y.o.   MRN: [id number]  HPI  Mr. Whitebread is an 80 yr old male who presents today for follow up.  1) HTN- maintained on atenolol. Back on atenolol.   BP Readings from Last 3 Encounters:  01/10/14 110/70  10/06/13 150/96  03/10/13 110/80   2) BPH- maintained on flomax. Denies urinary difficulty. Nocturia 1-2 x/night.   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 He refused to make appointment. Declines.  Lab Results  Component Value Date   WBC 7.3 10/06/2013   HGB 16.3 10/06/2013   HCT 46.3 10/06/2013   MCV 91.1 10/06/2013   PLT 224 10/06/2013   4) Squamous cell carcinoma-  Started "nose drops" reports this has helped him breath through the night.   Review of Systems See HPI  Past Medical History  Diagnosis Date  . Hypertension   . Colon polyp   . BPH (benign prostatic hypertrophy)   . Polycythemia   . Kidney stones   . Generalized headaches   . Cancer     colon  . Arthritis     History   Social History  . Marital Status: Married    Spouse Name: N/A    Number of Children: 2  . Years of Education: N/A   Occupational History  . Not on file.   Social History Main Topics  . Smoking status: Never Smoker   . Smokeless tobacco: Never Used  . Alcohol Use: Yes     Comment: RARELY  . Drug Use: No  . Sexual Activity: Yes   Other Topics Concern  . Not on file   Social History Narrative   CAFFEINE USE:  1 cup coffee daily   Regular exercise:  5 x weekly   Married for 1 year to a woman who is 86.     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           Past Surgical History  Procedure Laterality Date  . Colon surgery  2005    colon cancer  . Hand surgery  2009    left hand, ?carpal tunnel release  . Hernia repair  2005    ?inguinal   times 3  . Mass biopsy  12/25/2011    Procedure: NECK MASS BIOPSY;  Surgeon: Ruby Cola, MD;   Location: [facility] OR;  Service: ENT;  Laterality: Left;  Left open neck BX    Family History  Problem Relation Age of Onset  . Heart disease Mother     due to rheumatic fever    No Known Allergies  Current Outpatient Prescriptions on File Prior to Visit  Medication Sig Dispense Refill  . aspirin EC 81 MG tablet Take 81 mg by mouth daily.      Marland Kitchen atenolol (TENORMIN) 25 MG tablet Take 1 tablet (25 mg total) by mouth 2 (two) times daily.  60 tablet  2  . tamsulosin (FLOMAX) 0.4 MG CAPS capsule TAKE 1 CAPSULE BY MOUTH ONCE DAILY  30 capsule  5   No current facility-administered medications on file prior to visit.    BP 110/70  Pulse 65  Temp(Src) 98.5 F (36.9 C) (Oral)  Resp 16  Ht 5' 8.5" (1.74 m)  Wt 187 lb 6.4 oz (85.004 kg)  BMI 28.08 kg/m2  SpO2 96%       Objective:  Physical Exam  Constitutional: He is oriented to person, place, and time. He appears well-developed and well-nourished. No distress.  HENT:  Head: Normocephalic and atraumatic.  Moist oral mucosa  Cardiovascular: Normal rate and regular rhythm.   No murmur heard. Pulmonary/Chest: Effort normal and breath sounds normal. No respiratory distress. He has no wheezes. He has no rales. He exhibits no tenderness.  Neurological: He is alert and oriented to person, place, and time.  Psychiatric: He has a normal mood and affect. His behavior is normal. Judgment and thought content normal.          Assessment & Plan:

## 2014-01-10 NOTE — Assessment & Plan Note (Signed)
Stable on flomax.   

## 2014-01-10 NOTE — Assessment & Plan Note (Signed)
Advised pt to establish with new hematologist, will has hematology to contact pt again.

## 2014-01-10 NOTE — Assessment & Plan Note (Signed)
BP improved, continue beta blocker.

## 2014-01-10 NOTE — Assessment & Plan Note (Signed)
Pt notes improvement in dry mouth symptoms and overall feeling much better.  Management per Dr. Nicolette Bang.

## 2014-01-10 NOTE — Telephone Encounter (Signed)
Pt is now agreeable to set appointment up.  Could you please call him back? Thanks.

## 2014-01-10 NOTE — Progress Notes (Signed)
Pre visit review using our clinic review tool, if applicable. No additional management support is needed unless otherwise documented below in the visit note. 

## 2014-01-10 NOTE — Patient Instructions (Addendum)
You will be contacted about your referral to Dr. Marin Olp. Please follow up in 4 months.

## 2014-01-11 NOTE — Telephone Encounter (Signed)
Message copied by Debbrah Alar on Thu Jan 11, 2014  8:12 PM ------      Message from: Jeanmarie Plant      Created: Thu Jan 11, 2014  2:55 PM       Hey, I called him. He said as long as I don't have to pay a co-pay I will come. I said we don't require co-pay's to be paid but they will bill you for it. He took my number and said he might call back to schedule. ------

## 2014-01-24 ENCOUNTER — Telehealth: Payer: Self-pay

## 2014-01-24 NOTE — Telephone Encounter (Signed)
Brandon Hensley [phone]  Taeshaun called to see why we were not sending information back to Brandon Hensley, I could not find anything in file so I got his insurance information [phone], Claim #[id number] Policy # [id number], Contact Brandon Hensley. I called [employer] and spoke with Brandon Hensley in claims department, she looked in file and found that they had sent a request to our medical records department on 01/17/2014 and also spoke with a Renee and she told them that we have 7-10 work day turn around requesting records so they could determine if the cancer was pre existing. This claim is concerning hospitalization back in 2013.

## 2014-01-29 ENCOUNTER — Encounter: Payer: Self-pay | Admitting: Internal Medicine

## 2014-02-08 ENCOUNTER — Other Ambulatory Visit: Payer: Self-pay | Admitting: *Deleted

## 2014-02-08 DIAGNOSIS — D751 Secondary polycythemia: Secondary | ICD-10-CM

## 2014-02-09 ENCOUNTER — Encounter: Payer: Self-pay | Admitting: Hematology & Oncology

## 2014-02-09 ENCOUNTER — Ambulatory Visit (HOSPITAL_BASED_OUTPATIENT_CLINIC_OR_DEPARTMENT_OTHER): Payer: Medicare Other | Admitting: Hematology & Oncology

## 2014-02-09 ENCOUNTER — Other Ambulatory Visit (HOSPITAL_BASED_OUTPATIENT_CLINIC_OR_DEPARTMENT_OTHER): Payer: Medicare Other | Admitting: Lab

## 2014-02-09 DIAGNOSIS — D751 Secondary polycythemia: Secondary | ICD-10-CM

## 2014-02-09 DIAGNOSIS — Z85831 Personal history of malignant neoplasm of soft tissue: Secondary | ICD-10-CM

## 2014-02-09 DIAGNOSIS — Z85038 Personal history of other malignant neoplasm of large intestine: Secondary | ICD-10-CM

## 2014-02-09 DIAGNOSIS — C4442 Squamous cell carcinoma of skin of scalp and neck: Secondary | ICD-10-CM

## 2014-02-09 LAB — CBC WITH DIFFERENTIAL (CANCER CENTER ONLY)
BASO#: 0 10*3/uL (ref 0.0–0.2)
BASO%: 0.4 % (ref 0.0–2.0)
EOS%: 3.6 % (ref 0.0–7.0)
Eosinophils Absolute: 0.3 10*3/uL (ref 0.0–0.5)
HCT: 44.9 % (ref 38.7–49.9)
HGB: 15.8 g/dL (ref 13.0–17.1)
LYMPH#: 1.3 10*3/uL (ref 0.9–3.3)
LYMPH%: 16.3 % (ref 14.0–48.0)
MCH: 32.8 pg (ref 28.0–33.4)
MCHC: 35.2 g/dL (ref 32.0–35.9)
MCV: 93 fL (ref 82–98)
MONO#: 1 10*3/uL — ABNORMAL HIGH (ref 0.1–0.9)
MONO%: 11.7 % (ref 0.0–13.0)
NEUT%: 68 % (ref 40.0–80.0)
NEUTROS ABS: 5.5 10*3/uL (ref 1.5–6.5)
Platelets: 140 10*3/uL — ABNORMAL LOW (ref 145–400)
RBC: 4.82 10*6/uL (ref 4.20–5.70)
RDW: 13 % (ref 11.1–15.7)
WBC: 8.1 10*3/uL (ref 4.0–10.0)

## 2014-02-09 LAB — COMPREHENSIVE METABOLIC PANEL
ALBUMIN: 3.9 g/dL (ref 3.5–5.2)
ALK PHOS: 80 U/L (ref 39–117)
ALT: 13 U/L (ref 0–53)
AST: 19 U/L (ref 0–37)
BUN: 19 mg/dL (ref 6–23)
CALCIUM: 9.2 mg/dL (ref 8.4–10.5)
CO2: 27 mEq/L (ref 19–32)
CREATININE: 1.27 mg/dL (ref 0.50–1.35)
Chloride: 104 mEq/L (ref 96–112)
GLUCOSE: 133 mg/dL — AB (ref 70–99)
POTASSIUM: 4.1 meq/L (ref 3.5–5.3)
Sodium: 140 mEq/L (ref 135–145)
Total Bilirubin: 0.7 mg/dL (ref 0.2–1.2)
Total Protein: 6.3 g/dL (ref 6.0–8.3)

## 2014-02-10 NOTE — Progress Notes (Signed)
Hematology and Oncology Follow Up Visit  Brandon Hensley [id number] [birth year] 80 y.o. 02/10/2014   Principle Diagnosis:  1. Secondary polycythemia.  2. Stage I colon cancer  3.Squamous cell carcinoma of the head and neckstatus post neck dissection followed by radiation therapy he declined chemotherapy.  Current Therapy:    Phlebotomies for hematocrit over 45%     Interim History:  Mr.  Brandon Hensley is in for his first office visit. He's been seen by Dr. Humphrey Rolls in the past. Has a history of squamous cell carcinoma the head neck. He underwent a neck dissection. This is in [facility]. This was done probably 2 or 3 years ago. He then had radiation treatments. I think this was done at [facility] been no evidence of recurrence. He's followed at [facility].  He is a past history of colon cancer. His stage I. This was back in 2003. From my point of view, he is cured of this.  He is doing pretty well. He has a lot of neck thickness were have irradiation. I told her this probably is not going to go away. This was nothing to do about this.  He has a dry mouth. He's trying to eat as much as he can. He still pretty good at this.  He's had no problems with pain.  There's been no bleeding. He's had no nausea or vomiting. He's had no abdominal pain. He's had no change in bowel or bladder habits.  He seems to be sleeping pretty well.  Medications: Current outpatient prescriptions:aspirin EC 81 MG tablet, Take 81 mg by mouth daily., Disp: , Rfl: ;  atenolol (TENORMIN) 25 MG tablet, Take 1 tablet (25 mg total) by mouth 2 (two) times daily., Disp: 60 tablet, Rfl: 2;  tamsulosin (FLOMAX) 0.4 MG CAPS capsule, TAKE 1 CAPSULE BY MOUTH ONCE DAILY, Disp: 30 capsule, Rfl: 5  Allergies: No Known Allergies  Past Medical History, Surgical history, Social history, and Family History were reviewed and updated.  Review of Systems: As above  Physical Exam:  height is 5\' 8"  (1.727  m) and weight is 187 lb (84.823 kg). His oral temperature is 97.8 F (36.6 C). His blood pressure is 144/68 and his pulse is 55. His respiration is 18.   Elderly white gentleman. Head and neck exam shows no ocular or oral lesions. He has dry oral mucosa. He has the neck dissection scar under the left mandible. He has some thickening of the tissue in the anterior neck. No adenopathy is noted in the neck. Lungs are clear. Cardiac exam regular in rhythm. He has no murmurs, rubs or bruits. Abdomen is soft. Has good bowel sounds. His well-healed laparotomy scar. There is no fluid wave. There is no palpable liver or spleen tip. Extremities shows no clubbing, cyanosis or edema. Neurological exam is nonfocal. Skin exam shows no rashes, ecchymosis or petechia.  Lab Results  Component Value Date   WBC 8.1 02/09/2014   HGB 15.8 02/09/2014   HCT 44.9 02/09/2014   MCV 93 02/09/2014   PLT 140* 02/09/2014     Chemistry      Component Value Date/Time   NA 140 02/09/2014 1054   NA 140 10/03/2012 0925   NA 142 07/27/2008 0841   K 4.1 02/09/2014 1054   K 4.2 10/03/2012 0925   K 4.6 07/27/2008 0841   CL 104 02/09/2014 1054   CL 102 10/03/2012 0925   CL 99 07/27/2008 0841   CO2 27 02/09/2014 1054  CO2 30* 10/03/2012 0925   CO2 29 07/27/2008 0841   BUN 19 02/09/2014 1054   BUN 11.5 10/03/2012 0925   BUN 17 07/27/2008 0841   CREATININE 1.27 02/09/2014 1054   CREATININE 1.07 10/16/2013 0913   CREATININE 1.1 10/03/2012 0925      Component Value Date/Time   CALCIUM 9.2 02/09/2014 1054   CALCIUM 9.5 10/03/2012 0925   CALCIUM 9.2 07/27/2008 0841   ALKPHOS 80 02/09/2014 1054   ALKPHOS 87 10/03/2012 0925   ALKPHOS 91* 07/27/2008 0841   AST 19 02/09/2014 1054   AST 20 10/03/2012 0925   AST 25 07/27/2008 0841   ALT 13 02/09/2014 1054   ALT 16 10/03/2012 0925   ALT 24 07/27/2008 0841   BILITOT 0.7 02/09/2014 1054   BILITOT 0.77 10/03/2012 0925   BILITOT 1.00 07/27/2008 0841         Impression and Plan: Brandon Hensley is 80 year old  gentleman. He is a past history of colon cancer and had neck cancer. I will I do think that he is curable. I suppose of the head and neck cancer for was be a problem.  I cannot find anything on exam that would be indicative of recurrence.  He is followed at [facility].  He does not have been phlebotomized for the secondary polycythemia.  I think we are probably get him back in about 3 months.  He was last seen by Dr. Humphrey Rolls over a year ago.  I spent about 45 minutes with him. He is really a new patient to was. Had to take a lot out of figure out what was going on in the past as to what we needed to do with him going forward.   Volanda Napoleon, MD 10/10/20158:12 AM

## 2014-04-10 ENCOUNTER — Other Ambulatory Visit: Payer: Self-pay | Admitting: Family

## 2014-05-11 ENCOUNTER — Ambulatory Visit (HOSPITAL_BASED_OUTPATIENT_CLINIC_OR_DEPARTMENT_OTHER): Payer: Medicare Other

## 2014-05-11 ENCOUNTER — Other Ambulatory Visit (HOSPITAL_BASED_OUTPATIENT_CLINIC_OR_DEPARTMENT_OTHER): Payer: Medicare Other | Admitting: Lab

## 2014-05-11 ENCOUNTER — Ambulatory Visit (HOSPITAL_BASED_OUTPATIENT_CLINIC_OR_DEPARTMENT_OTHER): Payer: Medicare Other | Admitting: Family

## 2014-05-11 DIAGNOSIS — C4442 Squamous cell carcinoma of skin of scalp and neck: Secondary | ICD-10-CM

## 2014-05-11 DIAGNOSIS — I10 Essential (primary) hypertension: Secondary | ICD-10-CM | POA: Diagnosis not present

## 2014-05-11 DIAGNOSIS — D751 Secondary polycythemia: Secondary | ICD-10-CM

## 2014-05-11 DIAGNOSIS — C760 Malignant neoplasm of head, face and neck: Secondary | ICD-10-CM

## 2014-05-11 DIAGNOSIS — Z8589 Personal history of malignant neoplasm of other organs and systems: Secondary | ICD-10-CM | POA: Diagnosis not present

## 2014-05-11 DIAGNOSIS — Z85038 Personal history of other malignant neoplasm of large intestine: Secondary | ICD-10-CM

## 2014-05-11 LAB — IRON AND TIBC CHCC
%SAT: 28 % (ref 20–55)
Iron: 80 ug/dL (ref 42–163)
TIBC: 281 ug/dL (ref 202–409)
UIBC: 201 ug/dL (ref 117–376)

## 2014-05-11 LAB — CBC WITH DIFFERENTIAL (CANCER CENTER ONLY)
BASO#: 0 10*3/uL (ref 0.0–0.2)
BASO%: 0.3 % (ref 0.0–2.0)
EOS%: 3.6 % (ref 0.0–7.0)
Eosinophils Absolute: 0.3 10*3/uL (ref 0.0–0.5)
HCT: 47.8 % (ref 38.7–49.9)
HGB: 16.4 g/dL (ref 13.0–17.1)
LYMPH#: 1.6 10*3/uL (ref 0.9–3.3)
LYMPH%: 20.4 % (ref 14.0–48.0)
MCH: 32.4 pg (ref 28.0–33.4)
MCHC: 34.3 g/dL (ref 32.0–35.9)
MCV: 95 fL (ref 82–98)
MONO#: 0.9 10*3/uL (ref 0.1–0.9)
MONO%: 11.7 % (ref 0.0–13.0)
NEUT%: 64 % (ref 40.0–80.0)
NEUTROS ABS: 5.1 10*3/uL (ref 1.5–6.5)
Platelets: 185 10*3/uL (ref 145–400)
RBC: 5.06 10*6/uL (ref 4.20–5.70)
RDW: 13.1 % (ref 11.1–15.7)
WBC: 8 10*3/uL (ref 4.0–10.0)

## 2014-05-11 LAB — TSH CHCC: TSH: 7.82 m[IU]/L — AB (ref 0.320–4.118)

## 2014-05-11 LAB — FERRITIN CHCC: FERRITIN: 66 ng/mL (ref 22–316)

## 2014-05-11 NOTE — Patient Instructions (Signed)

## 2014-05-11 NOTE — Progress Notes (Signed)
[place]  [phone] [phone]  ID: Brandon Hensley OB: [birth year] MR#: [id number] GYJ#:[id number] Patient Care Team: Debbrah Alar, NP as PCP - General (Internal Medicine)  DIAGNOSIS: 1. Secondary polycythemia. 2. 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  INTERVAL HISTORY: Brandon Hensley is here today for a follow-up. He is doing well. He has had no issues since we last saw him.   He has a history of squamous cell carcinoma of the head neck. He underwent a neck dissection at [facility] 2 or 3 years ago. He then had radiation treatments at [facility]. There has been no evidence of recurrence. He continues to be followed at [facility].  He also has a past history of stage 1 colon cancer in 2003.   He denies fever, chills, n/v, cough, rash, headache, dizziness, SOB, chest pain, palpitations, abdominal pain, constipation, diarrhea, blood in urine or stool. No bleeding or pain.  His appetite is good and he drinks plenty of fluids. His weight is stable at 186 lbs.  No swelling, tenderness, numbness or tingling in his extremities.   CURRENT TREATMENT: Phlebotomies for hematocrit over 45%  REVIEW OF SYSTEMS: All other 10 point review of systems is negative.   PAST MEDICAL HISTORY: Past Medical History  Diagnosis Date  . Hypertension   . Colon polyp   . BPH (benign prostatic hypertrophy)   . Polycythemia   . Kidney stones   . Generalized headaches   . Cancer     colon  . Arthritis     PAST SURGICAL HISTORY: Past Surgical History  Procedure Laterality Date  . Colon surgery  2005    colon cancer  . Hand surgery  2009    left hand, ?carpal tunnel release  . Hernia repair  2005    ?inguinal   times 3  . Mass biopsy  12/25/2011    Procedure: NECK MASS BIOPSY;  Surgeon: Ruby Cola, MD;  Location: [facility] OR;  Service:  ENT;  Laterality: Left;  Left open neck BX    FAMILY HISTORY Family History  Problem Relation Age of Onset  . Heart disease Mother     due to rheumatic fever    GYNECOLOGIC HISTORY:  No LMP for male patient.   SOCIAL HISTORY: History   Social History  . Marital Status: Married    Spouse Name: N/A    Number of Children: 2  . Years of Education: N/A   Occupational History  . Not on file.   Social History Main Topics  . Smoking status: Never Smoker   . Smokeless tobacco: Never Used     Comment: never used tobacco  . Alcohol Use: Yes     Comment: RARELY  . Drug Use: No  . Sexual Activity: Yes   Other Topics Concern  . Not on file   Social History Narrative   CAFFEINE USE:  1 cup coffee daily   Regular exercise:  5 x weekly   Married for 1 year to a woman who is 2.     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           ADVANCED DIRECTIVES:  <no information>  HEALTH MAINTENANCE: History  Substance Use Topics  . Smoking status: Never Smoker   . Smokeless tobacco: Never Used     Comment: never used tobacco  .  Alcohol Use: Yes     Comment: RARELY   Colonoscopy: PAP: Bone density: Lipid panel:  No Known Allergies  Current Outpatient Prescriptions  Medication Sig Dispense Refill  . aspirin EC 81 MG tablet Take 81 mg by mouth daily.    Marland Kitchen atenolol (TENORMIN) 25 MG tablet TAKE 1 TABLET BY MOUTH TWICE DAILY 60 tablet 1  . tamsulosin (FLOMAX) 0.4 MG CAPS capsule TAKE 1 CAPSULE BY MOUTH ONCE DAILY 30 capsule 5   No current facility-administered medications for this visit.    OBJECTIVE: Filed Vitals:   05/11/14 1040  BP: 144/82  Pulse: 53  Temp: 98 F (36.7 C)  Resp: 18    Filed Weights   05/11/14 1040  Weight: 186 lb (84.369 kg)   ECOG FS:0 - Asymptomatic Ocular: Sclerae unicteric, pupils equal, round and reactive to light Ear-nose-throat: Oropharynx clear, dentition fair Lymphatic: No cervical or  supraclavicular adenopathy Lungs no rales or rhonchi, good excursion bilaterally Heart regular rate and rhythm, no murmur appreciated Abd soft, nontender, positive bowel sounds MSK no focal spinal tenderness, no joint edema Neuro: non-focal, well-oriented, appropriate affect  LAB RESULTS: CMP     Component Value Date/Time   NA 140 02/09/2014 1054   NA 140 10/03/2012 0925   NA 142 07/27/2008 0841   K 4.1 02/09/2014 1054   K 4.2 10/03/2012 0925   K 4.6 07/27/2008 0841   CL 104 02/09/2014 1054   CL 102 10/03/2012 0925   CL 99 07/27/2008 0841   CO2 27 02/09/2014 1054   CO2 30* 10/03/2012 0925   CO2 29 07/27/2008 0841   GLUCOSE 133* 02/09/2014 1054   GLUCOSE 105* 10/03/2012 0925   GLUCOSE 106 07/27/2008 0841   BUN 19 02/09/2014 1054   BUN 11.5 10/03/2012 0925   BUN 17 07/27/2008 0841   CREATININE 1.27 02/09/2014 1054   CREATININE 1.07 10/16/2013 0913   CREATININE 1.1 10/03/2012 0925   CALCIUM 9.2 02/09/2014 1054   CALCIUM 9.5 10/03/2012 0925   CALCIUM 9.2 07/27/2008 0841   PROT 6.3 02/09/2014 1054   PROT 6.8 10/03/2012 0925   PROT 6.8 07/27/2008 0841   ALBUMIN 3.9 02/09/2014 1054   ALBUMIN 3.7 10/03/2012 0925   AST 19 02/09/2014 1054   AST 20 10/03/2012 0925   AST 25 07/27/2008 0841   ALT 13 02/09/2014 1054   ALT 16 10/03/2012 0925   ALT 24 07/27/2008 0841   ALKPHOS 80 02/09/2014 1054   ALKPHOS 87 10/03/2012 0925   ALKPHOS 91* 07/27/2008 0841   BILITOT 0.7 02/09/2014 1054   BILITOT 0.77 10/03/2012 0925   BILITOT 1.00 07/27/2008 0841   GFRNONAA 47* 12/22/2011 1530   GFRNONAA 49* 08/11/2011 0928   GFRAA 55* 12/22/2011 1530   GFRAA 56* 08/11/2011 0928   INo results found for: SPEP, UPEP Lab Results  Component Value Date   WBC 8.0 05/11/2014   NEUTROABS 5.1 05/11/2014   HGB 16.4 05/11/2014   HCT 47.8 05/11/2014   MCV 95 05/11/2014   PLT 185 05/11/2014   No results found for: LABCA2 No components found for: HQPRF163 No results for input(s): INR in the last  168 hours.  STUDIES: No results found.  ASSESSMENT/PLAN: Brandon Hensley is 81 year old gentleman with a past history of colon cancer and head/neck cancer. I will I do think that he is curable. I suppose of the head and neck cancer for was be a problem. There has been no evidence of recurrence. He continues to be followed by [facility]. He  is asymptomatic but his Hct is 47.8 so we will phlebotomize him today.  We will see him back in 3 months for labs and follow-up.  He knows to call here with any questions or concerns and to go to the ED in the event of an emergency. We can certainly see him sooner if need be.   [name], NP 05/11/2014 11:10 AM

## 2014-05-11 NOTE — Progress Notes (Signed)
Brandon Hensley presents today for phlebotomy per MD orders. Phlebotomy procedure started at 1100 and ended at 1105. 512mL removed. Patient observed for 30 minutes after procedure without any incident. Patient tolerated procedure well. IV needle removed intact.

## 2014-05-15 ENCOUNTER — Ambulatory Visit: Payer: Medicare Other | Admitting: Family

## 2014-05-16 LAB — COMPREHENSIVE METABOLIC PANEL
ALT: 17 U/L (ref 0–53)
AST: 21 U/L (ref 0–37)
Albumin: 4 g/dL (ref 3.5–5.2)
Alkaline Phosphatase: 84 U/L (ref 39–117)
BILIRUBIN TOTAL: 0.6 mg/dL (ref 0.2–1.2)
BUN: 23 mg/dL (ref 6–23)
CO2: 28 mEq/L (ref 19–32)
Calcium: 9.2 mg/dL (ref 8.4–10.5)
Chloride: 100 mEq/L (ref 96–112)
Creatinine, Ser: 1.23 mg/dL (ref 0.50–1.35)
GLUCOSE: 88 mg/dL (ref 70–99)
Potassium: 4.4 mEq/L (ref 3.5–5.3)
Sodium: 139 mEq/L (ref 135–145)
Total Protein: 6.7 g/dL (ref 6.0–8.3)

## 2014-05-16 LAB — HEMOCHROMATOSIS DNA-PCR(C282Y,H63D)

## 2014-05-18 ENCOUNTER — Ambulatory Visit (INDEPENDENT_AMBULATORY_CARE_PROVIDER_SITE_OTHER): Payer: Medicare Other | Admitting: Family

## 2014-05-18 ENCOUNTER — Encounter: Payer: Self-pay | Admitting: Family

## 2014-05-18 VITALS — BP 128/75 | HR 73 | Temp 98.1°F | Resp 18 | Ht 68.5 in | Wt 186.0 lb

## 2014-05-18 DIAGNOSIS — C760 Malignant neoplasm of head, face and neck: Secondary | ICD-10-CM

## 2014-05-18 DIAGNOSIS — D751 Secondary polycythemia: Secondary | ICD-10-CM | POA: Diagnosis not present

## 2014-05-18 DIAGNOSIS — I10 Essential (primary) hypertension: Secondary | ICD-10-CM

## 2014-05-18 DIAGNOSIS — N400 Enlarged prostate without lower urinary tract symptoms: Secondary | ICD-10-CM | POA: Diagnosis not present

## 2014-05-18 DIAGNOSIS — C4442 Squamous cell carcinoma of skin of scalp and neck: Secondary | ICD-10-CM

## 2014-05-18 MED ORDER — ATENOLOL 25 MG PO TABS: 25.0000 mg | ORAL_TABLET | Freq: Two times a day (BID) | ORAL | Status: DC

## 2014-05-18 MED ORDER — TAMSULOSIN HCL 0.4 MG PO CAPS: ORAL_CAPSULE | ORAL | Status: DC

## 2014-05-18 NOTE — Assessment & Plan Note (Signed)
Stable on flomax. Continue same.  

## 2014-05-18 NOTE — Assessment & Plan Note (Signed)
Stable, management per  Hematology.

## 2014-05-18 NOTE — Progress Notes (Signed)
Subjective:    Patient ID: Brandon Hensley, male    DOB: 08-12-1926, 81 y.o.   MRN: [id number]  HPI  [name] is an 81 yr old male who presents today for follow up.  1) BPH- currently maintained on flomax. Reports normal void.  Reports nocturia x 1-2.  2) HTN- Patient is currently maintained on the following medications for blood pressure: atenolol.  Patient reports good compliance with blood pressure medications. Patient denies chest pain, shortness of breath or swelling. Last 3 blood pressure readings in our office are as follows: BP Readings from Last 3 Encounters:  05/18/14 128/75  05/11/14 119/73  05/11/14 144/82   3) Squamous cell carcinoma-  Reports that his dry mouth is improved.  He is followed by the cancer center.    4) Polycythemia Vera-  Followed by Dr. Marin Olp.     Review of Systems See HPI  Past Medical History  Diagnosis Date  . Hypertension   . Colon polyp   . BPH (benign prostatic hypertrophy)   . Polycythemia   . Kidney stones   . Generalized headaches   . Cancer     colon  . Arthritis     History   Social History  . Marital Status: Married    Spouse Name: N/A    Number of Children: 2  . Years of Education: N/A   Occupational History  . Not on file.   Social History Main Topics  . Smoking status: Never Smoker   . Smokeless tobacco: Never Used     Comment: never used tobacco  . Alcohol Use: Yes     Comment: RARELY  . Drug Use: No  . Sexual Activity: Yes   Other Topics Concern  . Not on file   Social History Narrative   CAFFEINE USE:  1 cup coffee daily   Regular exercise:  5 x weekly   Married for 1 year to a woman who is 40.     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           Past Surgical History  Procedure Laterality Date  . Colon surgery  2005    colon cancer  . Hand surgery  2009    left hand, ?carpal tunnel release  . Hernia repair  2005    ?inguinal   times 3  . Mass  biopsy  12/25/2011    Procedure: NECK MASS BIOPSY;  Surgeon: Ruby Cola, MD;  Location: [facility] OR;  Service: ENT;  Laterality: Left;  Left open neck BX    Family History  Problem Relation Age of Onset  . Heart disease Mother     due to rheumatic fever    No Known Allergies  Current Outpatient Prescriptions on File Prior to Visit  Medication Sig Dispense Refill  . aspirin EC 81 MG tablet Take 81 mg by mouth daily.     No current facility-administered medications on file prior to visit.    BP 128/75 mmHg  Pulse 73  Temp(Src) 98.1 F (36.7 C) (Oral)  Resp 18  Ht 5' 8.5" (1.74 m)  Wt 186 lb (84.369 kg)  BMI 27.87 kg/m2  SpO2 99%       Objective:   Physical Exam  Constitutional: He is oriented to person, place, and time. He appears well-developed and well-nourished. No distress.  HENT:  Mouth/Throat: Oropharynx is clear and moist.  Cardiovascular: Normal rate and regular rhythm.   No  murmur heard. Pulmonary/Chest: Effort normal and breath sounds normal. No respiratory distress. He has no wheezes. He has no rales. He exhibits no tenderness.  Musculoskeletal: He exhibits no edema.  Neurological: He is alert and oriented to person, place, and time.  Psychiatric: He has a normal mood and affect. His behavior is normal. Judgment and thought content normal.          Assessment & Plan:

## 2014-05-18 NOTE — Assessment & Plan Note (Signed)
Management per oncology.  He is more comfortable now that his mouth is not as dry post radiation.

## 2014-05-18 NOTE — Progress Notes (Signed)
Pre visit review using our clinic review tool, if applicable. No additional management support is needed unless otherwise documented below in the visit note. 

## 2014-05-18 NOTE — Patient Instructions (Signed)
Please schedule a follow up appointment in 6 months.   

## 2014-05-18 NOTE — Assessment & Plan Note (Signed)
BP is stable, continue current meds. BMET is up to date. 

## 2014-06-05 ENCOUNTER — Other Ambulatory Visit: Payer: Self-pay | Admitting: Family

## 2014-06-06 NOTE — Telephone Encounter (Signed)
Medication Detail      Disp Refills Start End     tamsulosin (FLOMAX) 0.4 MG CAPS capsule 30 capsule 5 05/18/2014     Sig: TAKE 1 CAPSULE BY MOUTH ONCE DAILY    E-Prescribing Status: Receipt confirmed by pharmacy (05/18/2014 9:20 AM EST)     Pharmacy    Rx request Denied, pt has available refills , verified w/pharmacy/SLS

## 2014-06-11 ENCOUNTER — Encounter: Payer: Self-pay | Admitting: Internal Medicine

## 2014-06-15 DIAGNOSIS — C01 Malignant neoplasm of base of tongue: Secondary | ICD-10-CM | POA: Diagnosis not present

## 2014-06-15 DIAGNOSIS — Z08 Encounter for follow-up examination after completed treatment for malignant neoplasm: Secondary | ICD-10-CM | POA: Diagnosis not present

## 2014-07-20 ENCOUNTER — Other Ambulatory Visit: Payer: Self-pay | Admitting: Family

## 2014-07-20 ENCOUNTER — Telehealth: Payer: Self-pay | Admitting: Hematology & Oncology

## 2014-07-20 NOTE — Telephone Encounter (Signed)
Pt aware moved 4-7 to 4-20

## 2014-08-09 ENCOUNTER — Ambulatory Visit (INDEPENDENT_AMBULATORY_CARE_PROVIDER_SITE_OTHER): Payer: Medicare Other | Admitting: Internal Medicine

## 2014-08-09 ENCOUNTER — Ambulatory Visit: Payer: Medicare Other | Admitting: Hematology & Oncology

## 2014-08-09 ENCOUNTER — Encounter: Payer: Self-pay | Admitting: Internal Medicine

## 2014-08-09 ENCOUNTER — Other Ambulatory Visit: Payer: Medicare Other

## 2014-08-09 ENCOUNTER — Other Ambulatory Visit: Payer: Medicare Other | Admitting: Lab

## 2014-08-09 VITALS — BP 134/80 | HR 60 | Ht 68.0 in | Wt 191.1 lb

## 2014-08-09 DIAGNOSIS — Z85038 Personal history of other malignant neoplasm of large intestine: Secondary | ICD-10-CM | POA: Diagnosis not present

## 2014-08-09 DIAGNOSIS — R1312 Dysphagia, oropharyngeal phase: Secondary | ICD-10-CM

## 2014-08-09 DIAGNOSIS — Z86010 Personal history of colonic polyps: Secondary | ICD-10-CM | POA: Diagnosis not present

## 2014-08-09 NOTE — Progress Notes (Signed)
   Subjective:    Patient ID: Brandon Hensley, male    DOB: Jul 28, 1926, 81 y.o.   MRN: [id number] Chief Complaint: History of colon cancer and polyps, question colonoscopy HPI  Patient is here for follow-up. He is about 5 years out from his last colonoscopy at which point he had 2 small adenomas. He had a colon cancer resected in 2005. In 2013 he was treated for squamous cell carcinoma of the head and neck with a radical neck dissection, and radiation therapy and I think some chemotherapy. He's had persistent but slowly improving swallowing difficulties with some choking with liquids question aspiration mild, over time. He has hypersalivation at times he says her he cannot adequately clear his secretions it seems. He is not having any abdominal pain or gastrointestinal problems.  Medications, allergies, past medical history, past surgical history, family history and social history are reviewed and updated in the EMR.   Review of Systems As per HPI    Objective:   Physical Exam BP 134/80 mmHg  Pulse 60  Ht 5\' 8"  (1.727 m)  Wt 191 lb 2 oz (86.694 kg)  BMI 29.07 kg/m2 Elderly - younger than state Neck - scarring - surgical and firm post RT changes on right Tongue NL, oral mucosa - ok - telangiectasia on soft palatte Alert and oriented x 3 w/ appropriate affect       Assessment & Plan:   1. Oropharyngeal dysphagia status post radiation therapy and radical neck dissection   2. History of colon cancer   3. History of colonic polyps    At this point we have decided not to pursue routine colonoscopy and only to investigate signs or symptoms. He is not anemic in fact he has had phlebotomy for polycythemia and he is heterozygous for one of the hemachromatosis genes.  Between his age and comorbidities I don't think it makes sense to continue routine colonoscopy. He understands and agrees. I will see him as needed.  I did suggest he consider a modified barium swallow to see if there are  any tricks or techniques to help him swallow better, he says over time it slowly improved since his treatment and that his radiation therapist and his head and neck surgeon said it might take up to 5 years to totally improve.  I appreciate the opportunity to care for this patient.

## 2014-08-09 NOTE — Patient Instructions (Signed)
    Follow up with Dr. Carlean Purl as needed.    I appreciate the opportunity to care for you.

## 2014-08-22 ENCOUNTER — Other Ambulatory Visit (HOSPITAL_BASED_OUTPATIENT_CLINIC_OR_DEPARTMENT_OTHER): Payer: Medicare Other

## 2014-08-22 ENCOUNTER — Ambulatory Visit (HOSPITAL_BASED_OUTPATIENT_CLINIC_OR_DEPARTMENT_OTHER): Payer: Medicare Other | Admitting: Hematology & Oncology

## 2014-08-22 ENCOUNTER — Ambulatory Visit (HOSPITAL_BASED_OUTPATIENT_CLINIC_OR_DEPARTMENT_OTHER): Payer: Medicare Other

## 2014-08-22 ENCOUNTER — Encounter: Payer: Self-pay | Admitting: Hematology & Oncology

## 2014-08-22 DIAGNOSIS — D45 Polycythemia vera: Secondary | ICD-10-CM | POA: Diagnosis not present

## 2014-08-22 DIAGNOSIS — Z09 Encounter for follow-up examination after completed treatment for conditions other than malignant neoplasm: Secondary | ICD-10-CM | POA: Diagnosis not present

## 2014-08-22 DIAGNOSIS — Z8589 Personal history of malignant neoplasm of other organs and systems: Secondary | ICD-10-CM

## 2014-08-22 DIAGNOSIS — D751 Secondary polycythemia: Secondary | ICD-10-CM

## 2014-08-22 HISTORY — DX: Hemochromatosis, unspecified: E83.119

## 2014-08-22 LAB — CBC WITH DIFFERENTIAL (CANCER CENTER ONLY)
BASO#: 0 10*3/uL (ref 0.0–0.2)
BASO%: 0.3 % (ref 0.0–2.0)
EOS%: 4.4 % (ref 0.0–7.0)
Eosinophils Absolute: 0.3 10*3/uL (ref 0.0–0.5)
HEMATOCRIT: 49.1 % (ref 38.7–49.9)
HGB: 16.9 g/dL (ref 13.0–17.1)
LYMPH#: 1.3 10*3/uL (ref 0.9–3.3)
LYMPH%: 17.2 % (ref 14.0–48.0)
MCH: 32.5 pg (ref 28.0–33.4)
MCHC: 34.4 g/dL (ref 32.0–35.9)
MCV: 94 fL (ref 82–98)
MONO#: 1 10*3/uL — AB (ref 0.1–0.9)
MONO%: 13.1 % — ABNORMAL HIGH (ref 0.0–13.0)
NEUT#: 5 10*3/uL (ref 1.5–6.5)
NEUT%: 65 % (ref 40.0–80.0)
Platelets: 153 10*3/uL (ref 145–400)
RBC: 5.2 10*6/uL (ref 4.20–5.70)
RDW: 13.1 % (ref 11.1–15.7)
WBC: 7.7 10*3/uL (ref 4.0–10.0)

## 2014-08-22 LAB — COMPREHENSIVE METABOLIC PANEL
ALK PHOS: 70 U/L (ref 39–117)
ALT: 17 U/L (ref 0–53)
AST: 20 U/L (ref 0–37)
Albumin: 3.9 g/dL (ref 3.5–5.2)
BUN: 19 mg/dL (ref 6–23)
CALCIUM: 9 mg/dL (ref 8.4–10.5)
CO2: 28 mEq/L (ref 19–32)
CREATININE: 1.17 mg/dL (ref 0.50–1.35)
Chloride: 103 mEq/L (ref 96–112)
Glucose, Bld: 99 mg/dL (ref 70–99)
Potassium: 4.2 mEq/L (ref 3.5–5.3)
Sodium: 139 mEq/L (ref 135–145)
Total Bilirubin: 0.7 mg/dL (ref 0.2–1.2)
Total Protein: 6.6 g/dL (ref 6.0–8.3)

## 2014-08-22 LAB — TSH CHCC: TSH: 6.687 m(IU)/L — ABNORMAL HIGH (ref 0.320–4.118)

## 2014-08-22 LAB — IRON AND TIBC CHCC
%SAT: 56 % — ABNORMAL HIGH (ref 20–55)
IRON: 165 ug/dL — AB (ref 42–163)
TIBC: 293 ug/dL (ref 202–409)
UIBC: 127 ug/dL (ref 117–376)

## 2014-08-22 LAB — FERRITIN CHCC: Ferritin: 30 ng/ml (ref 22–316)

## 2014-08-22 NOTE — Progress Notes (Signed)
Brandon Hensley presents today for phlebotomy per MD orders. Phlebotomy procedure started at 1022 and ended at 1028. 529ml removed. Patient observed for 30 minutes after procedure without any incident. Patient tolerated procedure well. IV needle removed intact.

## 2014-08-22 NOTE — Patient Instructions (Signed)

## 2014-08-22 NOTE — Progress Notes (Signed)
Hematology and Oncology Follow Up Visit  Brandon Hensley [id number] 08-Aug-1926 81 y.o. 08/22/2014   Principle Diagnosis:   Hemochromatosis (heterozygous for H63D mutation)  Current Therapy:    Phlebotomy to maintain ferritin below 100     Interim History:  Brandon Hensley is back for follow-up. We saw him initially back in January. We did some lab work on him. Surprisingly enough, we found that he had hemochromatosis. He is heterozygous for one of the minor mutations. As such, we will get him on a phlebotomy program.  He is not aware of any wetting the family who has had any kind of liver issues or iron issues. He has 4 siblings that are still alive.  There was an issue with secondary polycythemia. His evaluation for this was pretty much unremarkable.  He still is having some slight issues with the radiation that he took for his head and neck cancer. He has a low bit of tightness in his neck.  He's had no nausea or vomiting. His appetite has been good. He still exercising.  Overall, his performance status is ECOG 0.  Medications:  Current outpatient prescriptions:  .  aspirin EC 81 MG tablet, Take 81 mg by mouth as needed. , Disp: , Rfl:  .  atenolol (TENORMIN) 25 MG tablet, Take 1 tablet (25 mg total) by mouth 2 (two) times daily., Disp: 60 tablet, Rfl: 5 .  tamsulosin (FLOMAX) 0.4 MG CAPS capsule, TAKE 1 CAPSULE BY MOUTH ONCE DAILY, Disp: 30 capsule, Rfl: 5  Allergies: No Known Allergies  Past Medical History, Surgical history, Social history, and Family History were reviewed and updated.  Review of Systems: As above  Physical Exam:  height is 5\' 8"  (1.727 m) and weight is 189 lb (85.73 kg). His oral temperature is 98.6 F (37 C). His blood pressure is 168/67 and his pulse is 51. His respiration is 18.   Wt Readings from Last 3 Encounters:  08/22/14 189 lb (85.73 kg)  08/09/14 191 lb 2 oz (86.694 kg)  05/18/14 186 lb (84.369 kg)     Well-developed and well-nourished  white gentleman. Head and neck exam shows no ocular or oral lesions. There are no palpable cervical or supraclavicular lymph nodes. Lungs are clear. Cardiac exam regular rate and rhythm with no murmurs, rubs or bruits. Abdomen is soft. He has good bowel sounds. There is no fluid wave. There is no palpable liver or spleen tip. Back exam shows no tenderness over the spine, ribs or hips. Externally shows no clubbing, cyanosis or edema. Skin exam shows no rashes, ecchymoses or petechia. Neurological exam shows no focal neurological deficits.  Lab Results  Component Value Date   WBC 7.7 08/22/2014   HGB 16.9 08/22/2014   HCT 49.1 08/22/2014   MCV 94 08/22/2014   PLT 153 08/22/2014     Chemistry      Component Value Date/Time   NA 139 05/11/2014 1002   NA 140 10/03/2012 0925   NA 142 07/27/2008 0841   K 4.4 05/11/2014 1002   K 4.2 10/03/2012 0925   K 4.6 07/27/2008 0841   CL 100 05/11/2014 1002   CL 102 10/03/2012 0925   CL 99 07/27/2008 0841   CO2 28 05/11/2014 1002   CO2 30* 10/03/2012 0925   CO2 29 07/27/2008 0841   BUN 23 05/11/2014 1002   BUN 11.5 10/03/2012 0925   BUN 17 07/27/2008 0841   CREATININE 1.23 05/11/2014 1002   CREATININE 1.07 10/16/2013 0913  CREATININE 1.1 10/03/2012 0925      Component Value Date/Time   CALCIUM 9.2 05/11/2014 1002   CALCIUM 9.5 10/03/2012 0925   CALCIUM 9.2 07/27/2008 0841   ALKPHOS 84 05/11/2014 1002   ALKPHOS 87 10/03/2012 0925   ALKPHOS 91* 07/27/2008 0841   AST 21 05/11/2014 1002   AST 20 10/03/2012 0925   AST 25 07/27/2008 0841   ALT 17 05/11/2014 1002   ALT 16 10/03/2012 0925   ALT 24 07/27/2008 0841   BILITOT 0.6 05/11/2014 1002   BILITOT 0.77 10/03/2012 0925   BILITOT 1.00 07/27/2008 0841         Impression and Plan: Brandon Hensley is 81 year old gentleman. He actually has hemochromatosis. I wrote down what he has. He's going to ask his siblings if they ever been tested. I suspect they probably have not been tested.  I will  go ahead and phlebotomize him today. I'm sure that his ferritin will be on the lower side which is what we want.  I told him not to take any vitamin C supplements. I told him not to take any vitamins with iron. He does not do this now.  I told him that he can eat what he wants.  Blood get him back now in about 2 months and we will see how he is doing.   Volanda Napoleon, MD 4/20/201610:13 AM

## 2014-08-23 ENCOUNTER — Ambulatory Visit: Payer: Medicare Other | Admitting: Hematology & Oncology

## 2014-08-23 ENCOUNTER — Other Ambulatory Visit: Payer: Medicare Other | Admitting: Lab

## 2014-08-27 LAB — HEMOCHROMATOSIS DNA-PCR(C282Y,H63D)

## 2014-09-12 ENCOUNTER — Telehealth: Payer: Self-pay | Admitting: Family

## 2014-09-12 NOTE — Telephone Encounter (Signed)
Relation to pt: self  Call back number: [phone]   Reason for call:  Pt stated he would like to speak with Earlie Counts and refused to elaborate

## 2014-09-12 NOTE — Telephone Encounter (Signed)
That is correct 

## 2014-09-12 NOTE — Telephone Encounter (Signed)
Notified pt and scheduled office visit for 09/14/14 at 9:30am. Pt cancelled 6 month f/u in July and wants to combine it with visit on 09/14/14.

## 2014-09-12 NOTE — Telephone Encounter (Signed)
Pt states insurance nurse told him he has cerumen in his right ear that needs to be washed out. States he does have decreased hearing in same ear it feels stopped up. Pt is not wanting to have an actual office visit to have this done. However he states Lenna Sciara is the only one that has been able to remove wax without feeling like he has been "water boarded". I told pt he would need an office visit if he is wanting Melissa to remove the wax.  Please confirm?

## 2014-09-14 ENCOUNTER — Encounter: Payer: Self-pay | Admitting: Family

## 2014-09-14 ENCOUNTER — Ambulatory Visit (INDEPENDENT_AMBULATORY_CARE_PROVIDER_SITE_OTHER): Payer: Medicare Other | Admitting: Family

## 2014-09-14 VITALS — BP 128/90 | HR 65 | Temp 98.1°F | Resp 16 | Ht 68.5 in | Wt 186.2 lb

## 2014-09-14 DIAGNOSIS — I10 Essential (primary) hypertension: Secondary | ICD-10-CM

## 2014-09-14 DIAGNOSIS — H6123 Impacted cerumen, bilateral: Secondary | ICD-10-CM | POA: Diagnosis not present

## 2014-09-14 DIAGNOSIS — N400 Enlarged prostate without lower urinary tract symptoms: Secondary | ICD-10-CM

## 2014-09-14 DIAGNOSIS — H6120 Impacted cerumen, unspecified ear: Secondary | ICD-10-CM | POA: Insufficient documentation

## 2014-09-14 MED ORDER — TAMSULOSIN HCL 0.4 MG PO CAPS: ORAL_CAPSULE | ORAL | Status: DC

## 2014-09-14 MED ORDER — ATENOLOL 25 MG PO TABS: 25.0000 mg | ORAL_TABLET | Freq: Two times a day (BID) | ORAL | Status: DC

## 2014-09-14 NOTE — Assessment & Plan Note (Signed)
BP stable on atenolol, continue same.

## 2014-09-14 NOTE — Patient Instructions (Signed)
Please schedule a follow up appointment in 6 months.   

## 2014-09-14 NOTE — Assessment & Plan Note (Signed)
Ceruminosis is noted.  Wax is removed by manual debridement with curette.  . Instructions for home care to prevent wax buildup are given.

## 2014-09-14 NOTE — Progress Notes (Signed)
Pre visit review using our clinic review tool, if applicable. No additional management support is needed unless otherwise documented below in the visit note. 

## 2014-09-14 NOTE — Assessment & Plan Note (Signed)
Stable on flomax, continue same.  

## 2014-09-14 NOTE — Progress Notes (Signed)
Subjective:    Patient ID: Brandon Hensley, male    DOB: [birth year], 81 y.o.   MRN: [id number]  HPI  Brandon Hensley is an 81 yr old male who presents today for follow up.  Hypertension Patient is currently maintained on the following medications for blood pressure: atenolol. Patient reports good compliance with blood pressure medications. Patient denies chest pain, shortness of breath or swelling. Last 3 blood pressure readings in our office are as follows:  BP Readings from Last 3 Encounters:  09/14/14 128/90  08/22/14 139/81  08/22/14 168/67   Ear problem- Reports that his right ear has cerumen impaction per RN who came to his house.  Reports that he cannot hear well out of that ear.    BPH-  Reports no voiding issues, continues flomax.         Review of Systems See HPI  Past Medical History  Diagnosis Date  . Hypertension   . Colon polyp   . BPH (benign prostatic hypertrophy)   . Polycythemia   . Kidney stones   . Generalized headaches   . Cancer     colon  . Arthritis   . Hemochromatosis 08/22/2014    History   Social History  . Marital Status: Married    Spouse Name: N/A  . Number of Children: 2  . Years of Education: N/A   Occupational History  . Retired    Social History Main Topics  . Smoking status: Never Smoker   . Smokeless tobacco: Never Used     Comment: never used tobacco  . Alcohol Use: 0.0 oz/week    0 Standard drinks or equivalent per week     Comment: RARELY  . Drug Use: No  . Sexual Activity: Yes   Other Topics Concern  . Not on file   Social History Narrative   CAFFEINE USE:  1 cup coffee daily   Regular exercise:  5 x weekly   Married for 1 year to a woman who is 77.     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           Past Surgical History  Procedure Laterality Date  . Colon surgery  2005    colon cancer  . Hand surgery  2009    left hand, ?carpal tunnel release  . Hernia repair   2005    ?inguinal   times 3  . Mass biopsy  12/25/2011    Procedure: NECK MASS BIOPSY;  Surgeon: Ruby Cola, MD;  Location: [facility];  Service: ENT;  Laterality: Left;  Left open neck BX  . Colonoscopy w/ polypectomy      Family History  Problem Relation Age of Onset  . Heart disease Mother     due to rheumatic fever  . Colon cancer Neg Hx   . Colon polyps Neg Hx   . Kidney disease Neg Hx   . Diabetes Neg Hx     No Known Allergies  Current Outpatient Prescriptions on File Prior to Visit  Medication Sig Dispense Refill  . aspirin EC 81 MG tablet Take 81 mg by mouth as needed.      No current facility-administered medications on file prior to visit.    BP 128/90 mmHg  Pulse 65  Temp(Src) 98.1 F (36.7 C) (Oral)  Resp 16  Ht 5' 8.5" (1.74 m)  Wt 186 lb 3.2 oz (84.46 kg)  BMI 27.90 kg/m2  SpO2 95%  Objective:   Physical Exam  Constitutional: He is oriented to person, place, and time. He appears well-developed and well-nourished. No distress.  HENT:  Head: Normocephalic and atraumatic.  Cerumen noted in bilateral ear canals R>L, Normal TM's bilaterally  Cardiovascular: Normal rate and regular rhythm.   No murmur heard. Pulmonary/Chest: Effort normal and breath sounds normal. No respiratory distress. He has no wheezes. He has no rales.  Neurological: He is alert and oriented to person, place, and time.  Skin: Skin is warm and dry.  Psychiatric: He has a normal mood and affect. His behavior is normal. Thought content normal.   Past Medical History  Diagnosis Date  . Hypertension   . Colon polyp   . BPH (benign prostatic hypertrophy)   . Polycythemia   . Kidney stones   . Generalized headaches   . Cancer     colon  . Arthritis   . Hemochromatosis 08/22/2014    History   Social History  . Marital Status: Married    Spouse Name: N/A  . Number of Children: 2  . Years of Education: N/A   Occupational History  . Retired    Social History Main Topics    . Smoking status: Never Smoker   . Smokeless tobacco: Never Used     Comment: never used tobacco  . Alcohol Use: 0.0 oz/week    0 Standard drinks or equivalent per week     Comment: RARELY  . Drug Use: No  . Sexual Activity: Yes   Other Topics Concern  . Not on file   Social History Narrative   CAFFEINE USE:  1 cup coffee daily   Regular exercise:  5 x weekly   Married for 1 year to a woman who is 35.     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           Past Surgical History  Procedure Laterality Date  . Colon surgery  2005    colon cancer  . Hand surgery  2009    left hand, ?carpal tunnel release  . Hernia repair  2005    ?inguinal   times 3  . Mass biopsy  12/25/2011    Procedure: NECK MASS BIOPSY;  Surgeon: Ruby Cola, MD;  Location: [facility];  Service: ENT;  Laterality: Left;  Left open neck BX  . Colonoscopy w/ polypectomy      Family History  Problem Relation Age of Onset  . Heart disease Mother     due to rheumatic fever  . Colon cancer Neg Hx   . Colon polyps Neg Hx   . Kidney disease Neg Hx   . Diabetes Neg Hx     No Known Allergies  Current Outpatient Prescriptions on File Prior to Visit  Medication Sig Dispense Refill  . aspirin EC 81 MG tablet Take 81 mg by mouth as needed.      No current facility-administered medications on file prior to visit.    BP 128/90 mmHg  Pulse 65  Temp(Src) 98.1 F (36.7 C) (Oral)  Resp 16  Ht 5' 8.5" (1.74 m)  Wt 186 lb 3.2 oz (84.46 kg)  BMI 27.90 kg/m2  SpO2 95%          Assessment & Plan:

## 2014-10-01 ENCOUNTER — Encounter (HOSPITAL_BASED_OUTPATIENT_CLINIC_OR_DEPARTMENT_OTHER): Payer: Self-pay

## 2014-10-01 ENCOUNTER — Inpatient Hospital Stay (HOSPITAL_BASED_OUTPATIENT_CLINIC_OR_DEPARTMENT_OTHER)
Admission: EM | Admit: 2014-10-01 | Discharge: 2014-10-03 | DRG: 244 | Disposition: A | Discharge status: Home / Self Care | Payer: Medicare Other | Attending: Cardiology | Admitting: Cardiology

## 2014-10-01 ENCOUNTER — Other Ambulatory Visit: Payer: Self-pay

## 2014-10-01 ENCOUNTER — Other Ambulatory Visit (HOSPITAL_BASED_OUTPATIENT_CLINIC_OR_DEPARTMENT_OTHER): Payer: Self-pay

## 2014-10-01 ENCOUNTER — Emergency Department (HOSPITAL_BASED_OUTPATIENT_CLINIC_OR_DEPARTMENT_OTHER): Payer: Medicare Other

## 2014-10-01 DIAGNOSIS — I129 Hypertensive chronic kidney disease with stage 1 through stage 4 chronic kidney disease, or unspecified chronic kidney disease: Secondary | ICD-10-CM | POA: Diagnosis not present

## 2014-10-01 DIAGNOSIS — R001 Bradycardia, unspecified: Secondary | ICD-10-CM | POA: Diagnosis present

## 2014-10-01 DIAGNOSIS — I499 Cardiac arrhythmia, unspecified: Secondary | ICD-10-CM | POA: Diagnosis not present

## 2014-10-01 DIAGNOSIS — I10 Essential (primary) hypertension: Secondary | ICD-10-CM | POA: Diagnosis not present

## 2014-10-01 DIAGNOSIS — I442 Atrioventricular block, complete: Secondary | ICD-10-CM

## 2014-10-01 DIAGNOSIS — D751 Secondary polycythemia: Secondary | ICD-10-CM | POA: Diagnosis present

## 2014-10-01 DIAGNOSIS — I452 Bifascicular block: Secondary | ICD-10-CM | POA: Diagnosis not present

## 2014-10-01 DIAGNOSIS — I441 Atrioventricular block, second degree: Secondary | ICD-10-CM

## 2014-10-01 DIAGNOSIS — N400 Enlarged prostate without lower urinary tract symptoms: Secondary | ICD-10-CM | POA: Diagnosis not present

## 2014-10-01 DIAGNOSIS — Z7982 Long term (current) use of aspirin: Secondary | ICD-10-CM

## 2014-10-01 DIAGNOSIS — Z923 Personal history of irradiation: Secondary | ICD-10-CM

## 2014-10-01 DIAGNOSIS — Z7951 Long term (current) use of inhaled steroids: Secondary | ICD-10-CM | POA: Diagnosis not present

## 2014-10-01 DIAGNOSIS — I5021 Acute systolic (congestive) heart failure: Secondary | ICD-10-CM | POA: Diagnosis not present

## 2014-10-01 DIAGNOSIS — Z85038 Personal history of other malignant neoplasm of large intestine: Secondary | ICD-10-CM

## 2014-10-01 DIAGNOSIS — Z959 Presence of cardiac and vascular implant and graft, unspecified: Secondary | ICD-10-CM

## 2014-10-01 DIAGNOSIS — I4430 Unspecified atrioventricular block: Secondary | ICD-10-CM | POA: Diagnosis not present

## 2014-10-01 DIAGNOSIS — N189 Chronic kidney disease, unspecified: Secondary | ICD-10-CM | POA: Diagnosis present

## 2014-10-01 DIAGNOSIS — N289 Disorder of kidney and ureter, unspecified: Secondary | ICD-10-CM | POA: Diagnosis present

## 2014-10-01 DIAGNOSIS — Z8589 Personal history of malignant neoplasm of other organs and systems: Secondary | ICD-10-CM | POA: Diagnosis not present

## 2014-10-01 DIAGNOSIS — E785 Hyperlipidemia, unspecified: Secondary | ICD-10-CM

## 2014-10-01 DIAGNOSIS — Z950 Presence of cardiac pacemaker: Secondary | ICD-10-CM | POA: Diagnosis not present

## 2014-10-01 LAB — CBC WITH DIFFERENTIAL/PLATELET
BASOS ABS: 0 10*3/uL (ref 0.0–0.1)
BASOS PCT: 0 % (ref 0–1)
EOS PCT: 4 % (ref 0–5)
Eosinophils Absolute: 0.3 10*3/uL (ref 0.0–0.7)
HCT: 47.7 % (ref 39.0–52.0)
Hemoglobin: 16 g/dL (ref 13.0–17.0)
LYMPHS PCT: 23 % (ref 12–46)
Lymphs Abs: 1.9 10*3/uL (ref 0.7–4.0)
MCH: 31.7 pg (ref 26.0–34.0)
MCHC: 33.5 g/dL (ref 30.0–36.0)
MCV: 94.6 fL (ref 78.0–100.0)
MONO ABS: 1 10*3/uL (ref 0.1–1.0)
Monocytes Relative: 12 % (ref 3–12)
Neutro Abs: 5.1 10*3/uL (ref 1.7–7.7)
Neutrophils Relative %: 61 % (ref 43–77)
PLATELETS: 145 10*3/uL — AB (ref 150–400)
RBC: 5.04 MIL/uL (ref 4.22–5.81)
RDW: 13.3 % (ref 11.5–15.5)
WBC: 8.4 10*3/uL (ref 4.0–10.5)

## 2014-10-01 LAB — COMPREHENSIVE METABOLIC PANEL
ALK PHOS: 93 U/L (ref 38–126)
ALT: 31 U/L (ref 17–63)
AST: 32 U/L (ref 15–41)
Albumin: 4.4 g/dL (ref 3.5–5.0)
Anion gap: 11 (ref 5–15)
BUN: 24 mg/dL — ABNORMAL HIGH (ref 6–20)
CO2: 27 mmol/L (ref 22–32)
CREATININE: 1.37 mg/dL — AB (ref 0.61–1.24)
Calcium: 9.5 mg/dL (ref 8.9–10.3)
Chloride: 102 mmol/L (ref 101–111)
GFR calc Af Amer: 53 mL/min — ABNORMAL LOW (ref >60)
GFR calc non Af Amer: 45 mL/min — ABNORMAL LOW (ref >60)
GLUCOSE: 96 mg/dL (ref 65–99)
Potassium: 4 mmol/L (ref 3.5–5.1)
Sodium: 140 mmol/L (ref 135–145)
TOTAL PROTEIN: 7.5 g/dL (ref 6.5–8.1)
Total Bilirubin: 0.6 mg/dL (ref 0.3–1.2)

## 2014-10-01 LAB — TROPONIN I: Troponin I: <0.03 ng/mL (ref <0.031)

## 2014-10-01 LAB — CBC
HCT: 42.9 % (ref 39.0–52.0)
Hemoglobin: 14.5 g/dL (ref 13.0–17.0)
MCH: 31.7 pg (ref 26.0–34.0)
MCHC: 33.8 g/dL (ref 30.0–36.0)
MCV: 93.9 fL (ref 78.0–100.0)
Platelets: 144 10*3/uL — ABNORMAL LOW (ref 150–400)
RBC: 4.57 MIL/uL (ref 4.22–5.81)
RDW: 13.3 % (ref 11.5–15.5)
WBC: 7.6 10*3/uL (ref 4.0–10.5)

## 2014-10-01 LAB — PROTIME-INR
INR: 1.03 (ref 0.00–1.49)
PROTHROMBIN TIME: 13.7 s (ref 11.6–15.2)

## 2014-10-01 LAB — URINALYSIS, ROUTINE W REFLEX MICROSCOPIC
BILIRUBIN URINE: NEGATIVE
Glucose, UA: NEGATIVE mg/dL
Hgb urine dipstick: NEGATIVE
Ketones, ur: NEGATIVE mg/dL
LEUKOCYTES UA: NEGATIVE
NITRITE: NEGATIVE
Protein, ur: NEGATIVE mg/dL
Specific Gravity, Urine: 1.016 (ref 1.005–1.030)
UROBILINOGEN UA: 0.2 mg/dL (ref 0.0–1.0)
pH: 6 (ref 5.0–8.0)

## 2014-10-01 LAB — CREATININE, SERUM
Creatinine, Ser: 1.35 mg/dL — ABNORMAL HIGH (ref 0.61–1.24)
GFR calc Af Amer: 54 mL/min — ABNORMAL LOW (ref >60)
GFR calc non Af Amer: 46 mL/min — ABNORMAL LOW (ref >60)

## 2014-10-01 LAB — MRSA PCR SCREENING: MRSA by PCR: NEGATIVE

## 2014-10-01 LAB — BRAIN NATRIURETIC PEPTIDE: B NATRIURETIC PEPTIDE 5: 317.5 pg/mL — AB (ref 0.0–100.0)

## 2014-10-01 MED ORDER — ASPIRIN 81 MG PO CHEW
324.0000 mg | CHEWABLE_TABLET | Freq: Once | ORAL | Status: AC
  Administered 2014-10-01: 324 mg via ORAL

## 2014-10-01 MED ORDER — TAMSULOSIN HCL 0.4 MG PO CAPS
0.4000 mg | ORAL_CAPSULE | Freq: Every day | ORAL | Status: DC
  Administered 2014-10-02 – 2014-10-03 (×2): 0.4 mg via ORAL
  Filled 2014-10-01 (×2): qty 1

## 2014-10-01 MED ORDER — CETYLPYRIDINIUM CHLORIDE 0.05 % MT LIQD
7.0000 mL | Freq: Two times a day (BID) | OROMUCOSAL | Status: DC
  Administered 2014-10-02: 7 mL via OROMUCOSAL

## 2014-10-01 MED ORDER — SODIUM CHLORIDE 0.9 % IV SOLN: INTRAVENOUS | Status: DC

## 2014-10-01 MED ORDER — ASPIRIN 81 MG PO CHEW
CHEWABLE_TABLET | ORAL | Status: AC
  Administered 2014-10-01: 324 mg via ORAL
  Filled 2014-10-01: qty 4

## 2014-10-01 MED ORDER — HEPARIN SODIUM (PORCINE) 5000 UNIT/ML IJ SOLN
5000.0000 [IU] | Freq: Three times a day (TID) | INTRAMUSCULAR | Status: DC
  Filled 2014-10-01 (×4): qty 1

## 2014-10-01 MED ORDER — HYDRALAZINE HCL 25 MG PO TABS
25.0000 mg | ORAL_TABLET | Freq: Three times a day (TID) | ORAL | Status: DC
  Administered 2014-10-01 – 2014-10-03 (×6): 25 mg via ORAL
  Filled 2014-10-01 (×8): qty 1

## 2014-10-01 MED ORDER — ATROPINE SULFATE 0.1 MG/ML IJ SOLN
INTRAMUSCULAR | Status: AC
  Filled 2014-10-01: qty 10

## 2014-10-01 MED ORDER — ASPIRIN EC 81 MG PO TBEC
81.0000 mg | DELAYED_RELEASE_TABLET | Freq: Every day | ORAL | Status: DC
  Administered 2014-10-02: 81 mg via ORAL
  Filled 2014-10-01: qty 1

## 2014-10-01 MED ORDER — ATROPINE SULFATE 0.1 MG/ML IJ SOLN
0.5000 mg | Freq: Once | INTRAMUSCULAR | Status: AC
  Administered 2014-10-01: 0.5 mg via INTRAVENOUS

## 2014-10-01 MED ORDER — ATROPINE SULFATE 0.1 MG/ML IJ SOLN
INTRAMUSCULAR | Status: AC
  Administered 2014-10-01: 0.5 mg via INTRAVENOUS
  Filled 2014-10-01: qty 10

## 2014-10-01 NOTE — ED Provider Notes (Signed)
CSN: [id number]     Arrival date & time 10/01/14  1329 History   First MD Initiated Contact with Patient 10/01/14 1342     Chief Complaint  Patient presents with  . Bradycardia     (Consider location/radiation/quality/duration/timing/severity/associated sxs/prior Treatment) HPI The patient reports over the past 2 days noting himself to be more fatigued by usual activities. He checked his pulse today when he sat down to rest and found that his heart was only beating at 35 beats a minute. He has had no syncopal episode. He has been expressing intermittent "indigestion". For about a month now he has been taking TUMS about once a day for indigestion. He has no prior cardiac history. The patient reports he intermittently takes aspirin. He is on atenolol but he denies any recent dose changes. Last dose changes 2 months ago. Past Medical History  Diagnosis Date  . Hypertension   . Colon polyp   . BPH (benign prostatic hypertrophy)   . Polycythemia   . Kidney stones   . Generalized headaches   . Arthritis   . Hemochromatosis 08/22/2014  . Cancer     colon  . 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 Colon surgery  2005    colon cancer  . Hand surgery  2009    left hand, ?carpal tunnel release  . Hernia repair  2005    ?inguinal   times 3  . Mass biopsy  12/25/2011    Procedure: NECK MASS BIOPSY;  Surgeon: Ruby Cola, MD;  Location: [facility];  Service: ENT;  Laterality: Left;  Left open neck BX  . Colonoscopy w/ polypectomy     Family History  Problem Relation Age of Onset  . Heart disease Mother     due to rheumatic fever  . Colon cancer Neg Hx   . Colon polyps Neg Hx   . Kidney disease Neg Hx   . Diabetes Neg Hx    History  Substance Use Topics  . Smoking status: Never Smoker   . Smokeless tobacco: Never Used     Comment: never used tobacco  . Alcohol Use: 0.0 oz/week    0 Standard drinks or equivalent per week      Comment: RARELY    Review of Systems 10 Systems reviewed and are negative for acute change except as noted in the HPI.    Allergies  Review of patient's allergies indicates no known allergies.  Home Medications   Prior to Admission medications   Medication Sig Start Date End Date Taking? Authorizing Provider  aspirin EC 81 MG tablet Take 81 mg by mouth as needed.    Yes Historical Provider, MD  fluticasone (FLONASE) 50 MCG/ACT nasal spray Place into both nostrils daily. 07/20/14  Yes Historical Provider, MD  tamsulosin (FLOMAX) 0.4 MG CAPS capsule TAKE 1 CAPSULE BY MOUTH ONCE DAILY 09/14/14  Yes Debbrah Alar, NP   BP 180/71 mmHg  Pulse 35  Temp(Src) 97.7 F (36.5 C) (Oral)  Resp 13  Ht 5\' 10"  (1.778 m)  Wt 187 lb 6.3 oz (85 kg)  BMI 26.89 kg/m2  SpO2 100% Physical Exam  Constitutional: He is oriented to person, place, and time. He appears well-developed and well-nourished.  HENT:  Head: Normocephalic and atraumatic.  Eyes: EOM are normal. Pupils are equal, round, and reactive to light.  Neck: Neck supple.  Cardiovascular: Normal rate, normal heart sounds and intact distal pulses.  Heart rate is bradycardic to the 30s. No gross rub or murmur  Pulmonary/Chest: Effort normal and breath sounds normal.  Abdominal: Soft. Bowel sounds are normal. He exhibits no distension. There is no tenderness.  Musculoskeletal: Normal range of motion. He exhibits no edema.  Neurological: He is alert and oriented to person, place, and time. He has normal strength. Coordination normal. GCS eye subscore is 4. GCS verbal subscore is 5. GCS motor subscore is 6.  Skin: Skin is warm, dry and intact.  Psychiatric: He has a normal mood and affect.    ED Course  Procedures (including critical care time) Labs Review Labs Reviewed  COMPREHENSIVE METABOLIC PANEL - Abnormal; Notable for the following:    BUN 24 (*)    Creatinine, Ser 1.37 (*)    GFR calc non Af Amer 45 (*)    GFR calc Af  Amer 53 (*)    All other components within normal limits  BRAIN NATRIURETIC PEPTIDE - Abnormal; Notable for the following:    B Natriuretic Peptide 317.5 (*)    All other components within normal limits  CBC WITH DIFFERENTIAL/PLATELET - Abnormal; Notable for the following:    Platelets 145 (*)    All other components within normal limits  MRSA PCR SCREENING  TROPONIN I  PROTIME-INR  URINALYSIS, ROUTINE W REFLEX MICROSCOPIC (NOT AT [facility])  CBC  CREATININE, SERUM    Imaging Review Dg Chest Port 1 View  10/01/2014   CLINICAL DATA:  Bradycardia  EXAM: PORTABLE CHEST - 1 VIEW  COMPARISON:  12/22/2011  FINDINGS: Chronic interstitial markings. No focal consolidation. No pleural effusion or pneumothorax.  Cardiomegaly.  IMPRESSION: No evidence of acute cardiopulmonary disease.   Electronically Signed   By: Julian Hy M.D.   On: 10/01/2014 14:16    EKG Interpretation  Date/Time:  Monday Oct 01 2014 13:38:01 EDT Ventricular Rate:  32 PR Interval:  216 QRS Duration: 142 QT Interval:  538 QTC Calculation: 392 R Axis:   -65 Text Interpretation:  Marked sinus bradycardia with 1st degree A-V block Right bundle branch block Left anterior fascicular block  Bifascicular block  Minimal voltage criteria for LVH, may be normal variant Septal infarct , age undetermined Abnormal ECG agree. unchanged morphology from old Confirmed by Johnney Killian, MD, Jeannie Done [phone]) on 10/01/2014 1:43:50 PM       CRITICAL CARE Performed by: Charlesetta Shanks   Total critical care time: 30  Critical care time was exclusive of separately billable procedures and treating other patients.  Critical care was necessary to treat or prevent imminent or life-threatening deterioration.  Critical care was time spent personally by me on the following activities: development of treatment plan with patient and/or surrogate as well as nursing, discussions with consultants, evaluation of patient's response to treatment, examination  of patient, obtaining history from patient or surrogate, ordering and performing treatments and interventions, ordering and review of laboratory studies, ordering and review of radiographic studies, pulse oximetry and re-evaluation of patient's condition. MDM   Final diagnoses:  Symptomatic sinus bradycardia   Patient presents with symptomatic bradycardia. He has not had a syncopal episode. He is perfusing with intact blood pressure and palpable pulses. He has normal mental status. There was no significant response to atropine. Transcutaneous pacing pads were applied but not implemented. Transfer arrangements were made with Dr. Meda Coffee of cardiology.    Charlesetta Shanks, MD 10/01/14 [phone]

## 2014-10-01 NOTE — ED Notes (Signed)
Pt states "my hearts not beating fast enough" reports HR 30s today-denies pain-steady gait into triage

## 2014-10-01 NOTE — H&P (Signed)
Patient ID: ALEXZANDER DOLINGER MRN: [id number], DOB/AGE: 1927/01/17   Admit date: 10/01/2014   Primary Physician: Nance Pear., NP Primary Cardiologist: new patient  Pt. Profile:  Symptomatic bradycardia  Problem List  Past Medical History  Diagnosis Date  . Hypertension   . Colon polyp   . BPH (benign prostatic hypertrophy)   . Polycythemia   . Kidney stones   . Generalized headaches   . Arthritis   . Hemochromatosis 08/22/2014  . Cancer     colon  . Cancer     throat-daignosed Nov 2013-radiation tx    Past Surgical History  Procedure Laterality Date  . Colon surgery  2005    colon cancer  . Hand surgery  2009    left hand, ?carpal tunnel release  . Hernia repair  2005    ?inguinal   times 3  . Mass biopsy  12/25/2011    Procedure: NECK MASS BIOPSY;  Surgeon: Ruby Cola, MD;  Location: [facility];  Service: ENT;  Laterality: Left;  Left open neck BX  . Colonoscopy w/ polypectomy       Allergies  No Known Allergies  HPI  Patient is a 81 y.o. male with a PMHx of hypertension, who was admitted to [facility] on 10/01/2014 for evaluation of weakness and exertional dyspnea and exertional presyncope. The patient appears younger than stated age and is very active, he was practicing martial arts until 10 years ago. He has noticed profound fatigue in the last 2 days, then he developed dizziness and dyspnea after walking 1 flight of stairs. No chest pain now or in the past. No syncope or palpitations. No claudication, no orthopnea or PND.  Home Medications  Prior to Admission medications   Medication Sig Start Date End Date Taking? Authorizing Provider  aspirin EC 81 MG tablet Take 81 mg by mouth as needed.    Yes Historical Provider, MD  fluticasone (FLONASE) 50 MCG/ACT nasal spray Place into both nostrils daily. 07/20/14  Yes Historical Provider, MD  tamsulosin (FLOMAX) 0.4 MG CAPS capsule TAKE 1 CAPSULE BY MOUTH ONCE DAILY 09/14/14  Yes Debbrah Alar, NP     Family History  Family History  Problem Relation Age of Onset  . Heart disease Mother     due to rheumatic fever  . Colon cancer Neg Hx   . Colon polyps Neg Hx   . Kidney disease Neg Hx   . Diabetes Neg Hx     Social History  History   Social History  . Marital Status: Married    Spouse Name: N/A  . Number of Children: 2  . Years of Education: N/A   Occupational History  . Retired    Social History Main Topics  . Smoking status: Never Smoker   . Smokeless tobacco: Never Used     Comment: never used tobacco  . Alcohol Use: 0.0 oz/week    0 Standard drinks or equivalent per week     Comment: RARELY  . Drug Use: No  . Sexual Activity: Not on file   Other Topics Concern  . Not on file   Social History Narrative   CAFFEINE USE:  1 cup coffee daily   Regular exercise:  5 x weekly   Married for 1 year to a woman who is 72.     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            Review of Systems  General:  No chills, fever, night sweats or weight changes.  Cardiovascular:  No chest pain, dyspnea on exertion, edema, orthopnea, palpitations, paroxysmal nocturnal dyspnea. Dermatological: No rash, lesions/masses Respiratory: No cough, dyspnea Urologic: No hematuria, dysuria Abdominal:   No nausea, vomiting, diarrhea, bright red blood per rectum, melena, or hematemesis Neurologic:  No visual changes, wkns, changes in mental status. All other systems reviewed and are otherwise negative except as noted above.  Physical Exam  Blood pressure 180/71, pulse 35, temperature 97.7 F (36.5 C), temperature source Oral, resp. rate 13, height 5\' 10"  (1.778 m), weight 187 lb 6.3 oz (85 kg), SpO2 100 %.  General: Pleasant, NAD Psych: Normal affect. Neuro: Alert and oriented X 3. Moves all extremities spontaneously. HEENT: Normal  Neck: Supple without bruits or JVD. Lungs:  Resp regular and unlabored, CTA. Heart: RRR no s3, s4, or  murmurs. Abdomen: Soft, non-tender, non-distended, BS + x 4.  Extremities: No clubbing, cyanosis or edema. DP/PT/Radials 2+ and equal bilaterally.  Labs   Recent Labs  10/01/14 1350  TROPONINI <0.03   Lab Results  Component Value Date   WBC 8.4 10/01/2014   HGB 16.0 10/01/2014   HCT 47.7 10/01/2014   MCV 94.6 10/01/2014   PLT 145* 10/01/2014    Recent Labs Lab 10/01/14 1350  NA 140  K 4.0  CL 102  CO2 27  BUN 24*  CREATININE 1.37*  CALCIUM 9.5  PROT 7.5  BILITOT 0.6  ALKPHOS 93  ALT 31  AST 32  GLUCOSE 96   Lab Results  Component Value Date   CHOL 161 08/11/2011   HDL 53 08/11/2011   LDLCALC 94 08/11/2011   TRIG 69 08/11/2011   Radiology/Studies  Dg Chest Port 1 View  10/01/2014   CLINICAL DATA:  Bradycardia  EXAM: PORTABLE CHEST - 1 VIEW  COMPARISON:  12/22/2011  FINDINGS: Chronic interstitial markings. No focal consolidation. No pleural effusion or pneumothorax.  Cardiomegaly.  IMPRESSION: No evidence of acute cardiopulmonary disease.   Electronically Signed   By: Julian Hy M.D.   On: 10/01/2014 14:16   Echocardiogram - none  ECG: 2:1 block, bifascicular block    ASSESSMENT AND PLAN  1. Advanced (infra His) 2:1 block with bifascicular block - symptomatic with presyncopal episode, dizziness, weakness, however the patient was on atenolol, we will discontinue. - We will observe, if needed transcutaneous pacer or Isuprel infusion can be used - echocardiogram ordered  2. Hypertension - avoid betablockers and calcium channel blockers (bradycardia) and ACEI/ARB (AKI)  3. Acute on chronic kidney failure - we will monitor, he has mildly elevated BNP but doesn't appear euvolemic  Signed, Dorothy Spark, MD, [facility] 10/01/2014, 4:48 PM

## 2014-10-02 ENCOUNTER — Inpatient Hospital Stay (HOSPITAL_COMMUNITY): Payer: Medicare Other

## 2014-10-02 ENCOUNTER — Encounter (HOSPITAL_COMMUNITY)
Admission: EM | Disposition: A | Discharge status: Home / Self Care | Payer: Medicare Other | Source: Home / Self Care | Attending: Cardiology

## 2014-10-02 DIAGNOSIS — I441 Atrioventricular block, second degree: Secondary | ICD-10-CM

## 2014-10-02 DIAGNOSIS — I442 Atrioventricular block, complete: Secondary | ICD-10-CM

## 2014-10-02 DIAGNOSIS — I4430 Unspecified atrioventricular block: Secondary | ICD-10-CM

## 2014-10-02 HISTORY — DX: Atrioventricular block, complete: I44.2

## 2014-10-02 HISTORY — PX: EP IMPLANTABLE DEVICE: SHX172B

## 2014-10-02 SURGERY — PACEMAKER IMPLANT

## 2014-10-02 MED ORDER — DEXTROSE 5 % IV SOLN
2.0000 g | INTRAVENOUS | Status: DC | PRN
  Administered 2014-10-02: 2 g via INTRAVENOUS

## 2014-10-02 MED ORDER — SODIUM CHLORIDE 0.9 % IR SOLN
80.0000 mg | Status: DC
  Filled 2014-10-02 (×2): qty 2

## 2014-10-02 MED ORDER — CEFAZOLIN SODIUM 1-5 GM-% IV SOLN
1.0000 g | Freq: Four times a day (QID) | INTRAVENOUS | Status: AC
  Administered 2014-10-02 – 2014-10-03 (×3): 1 g via INTRAVENOUS
  Filled 2014-10-02 (×3): qty 50

## 2014-10-02 MED ORDER — ONDANSETRON HCL 4 MG/2ML IJ SOLN: 4.0000 mg | Freq: Four times a day (QID) | INTRAMUSCULAR | Status: DC | PRN

## 2014-10-02 MED ORDER — CHLORHEXIDINE GLUCONATE 4 % EX LIQD: 60.0000 mL | Freq: Once | CUTANEOUS | Status: DC

## 2014-10-02 MED ORDER — MIDAZOLAM HCL 5 MG/5ML IJ SOLN
INTRAMUSCULAR | Status: AC
  Filled 2014-10-02: qty 5

## 2014-10-02 MED ORDER — IOHEXOL 350 MG/ML SOLN
INTRAVENOUS | Status: DC | PRN
  Administered 2014-10-02: 15 mL via INTRAVENOUS

## 2014-10-02 MED ORDER — CHLORHEXIDINE GLUCONATE 4 % EX LIQD
CUTANEOUS | Status: AC
  Filled 2014-10-02: qty 15

## 2014-10-02 MED ORDER — LIDOCAINE HCL (PF) 1 % IJ SOLN
INTRAMUSCULAR | Status: AC
  Filled 2014-10-02: qty 60

## 2014-10-02 MED ORDER — FENTANYL CITRATE (PF) 100 MCG/2ML IJ SOLN
INTRAMUSCULAR | Status: AC
  Filled 2014-10-02: qty 2

## 2014-10-02 MED ORDER — SODIUM CHLORIDE 0.9 % IV SOLN
INTRAVENOUS | Status: DC
  Administered 2014-10-02: 11:00:00 via INTRAVENOUS

## 2014-10-02 MED ORDER — CEFAZOLIN SODIUM-DEXTROSE 2-3 GM-% IV SOLR
2.0000 g | INTRAVENOUS | Status: DC
  Filled 2014-10-02: qty 50

## 2014-10-02 MED ORDER — YOU HAVE A PACEMAKER BOOK
Freq: Once | Status: AC
  Administered 2014-10-02: 10:00:00
  Filled 2014-10-02: qty 1

## 2014-10-02 MED ORDER — SODIUM CHLORIDE 0.9 % IV SOLN: 250.0000 mL | INTRAVENOUS | Status: DC | PRN

## 2014-10-02 MED ORDER — HYDROCODONE-ACETAMINOPHEN 5-325 MG PO TABS: 1.0000 | ORAL_TABLET | ORAL | Status: DC | PRN

## 2014-10-02 MED ORDER — CHLORHEXIDINE GLUCONATE 4 % EX LIQD
60.0000 mL | Freq: Once | CUTANEOUS | Status: AC
  Administered 2014-10-02: 4 via TOPICAL
  Filled 2014-10-02: qty 15

## 2014-10-02 MED ORDER — CEFAZOLIN SODIUM-DEXTROSE 2-3 GM-% IV SOLR
INTRAVENOUS | Status: AC
  Filled 2014-10-02: qty 50

## 2014-10-02 MED ORDER — SODIUM CHLORIDE 0.9 % IJ SOLN
3.0000 mL | Freq: Two times a day (BID) | INTRAMUSCULAR | Status: DC
  Administered 2014-10-02: 3 mL via INTRAVENOUS

## 2014-10-02 MED ORDER — SODIUM CHLORIDE 0.9 % IJ SOLN: 3.0000 mL | INTRAMUSCULAR | Status: DC | PRN

## 2014-10-02 MED ORDER — ACETAMINOPHEN 325 MG PO TABS: 325.0000 mg | ORAL_TABLET | ORAL | Status: DC | PRN

## 2014-10-02 SURGICAL SUPPLY — 8 items
CABLE SURGICAL S-101-97-12 (CABLE) ×2 IMPLANT
LEAD CAPSURE NOVUS 5076-52CM (Lead) ×2 IMPLANT
LEAD CAPSURE NOVUS 5076-58CM (Lead) ×2 IMPLANT
PAD DEFIB LIFELINK (PAD) ×3 IMPLANT
PPM ADVISA MRI DR A2DR01 (Pacemaker) ×2 IMPLANT
SHEATH CLASSIC 7F (SHEATH) ×4 IMPLANT
SHEATH PINNACLE 6F 10CM (SHEATH) ×3 IMPLANT
TRAY PACEMAKER INSERTION (CUSTOM PROCEDURE TRAY) ×2 IMPLANT

## 2014-10-02 NOTE — Interval H&P Note (Signed)
History and Physical Interval Note:  10/02/2014 3:05 PM  Brandon Hensley  has presented today for surgery, with the diagnosis of bradicardia  The various methods of treatment have been discussed with the patient and family. After consideration of risks, benefits and other options for treatment, the patient has consented to  Procedure(s): Pacemaker Implant (N/A) as a surgical intervention .  The patient's history has been reviewed, patient examined, no change in status, stable for surgery.  I have reviewed the patient's chart and labs.  Questions were answered to the patient's satisfaction.     Brandon Hensley

## 2014-10-02 NOTE — Care Management Note (Signed)
Case Management Note  Patient Details  Name: Brandon Hensley MRN: [id number] Date of Birth: 1927/01/08  Subjective/Objective:       Adm w brady, heart block             Action/Plan: lives w wife, sees np Earlie Counts   Expected Discharge Date:                  Expected Discharge Plan:  Home/Self Care  In-House Referral:     Discharge planning Services     Post Acute Care Choice:    Choice offered to:     DME Arranged:    DME Agency:     HH Arranged:    [place] Agency:     Status of Service:     Medicare Important Message Given:    Date Medicare IM Given:    Medicare IM give by:    Date Additional Medicare IM Given:    Additional Medicare Important Message give by:     If discussed at [place] of Stay Meetings, dates discussed:    Additional Comments:ur review done  Lacretia Leigh, RN 10/02/2014, 9:45 AM

## 2014-10-02 NOTE — Consult Note (Signed)
ELECTROPHYSIOLOGY CONSULT NOTE    Patient ID: Brandon Hensley MRN: [id number], DOB/AGE: 1926-12-20 81 y.o.  Admit date: 10/01/2014 Date of Consult: 10/02/2014  Primary Physician: Nance Pear., NP Primary Cardiologist: new this admission - [place]  Reason for Consultation: heart block  HPI:  TRUTH BAROT is a 81 y.o. male with a past medical history significant for hypertension, colon cancer, BPH, and polycythemia.  He has been maintained on Atenolol for hypertension. He presented yesterday with weakness and exertional pre-syncope.  This has been preceded by profound fatigue in the last 2 days.  On arrival, he was found to be in 2:1 heart block.  His atenolol has been held without improvement in conduction.  EP has been asked to evaluate for treatment options.  Echo is pending this admission.  Prior to the presentation of symptoms 3 days ago, the patient denies chest pain, shortness of breath, LE edema, palpitations, dizziness, recent fevers, chills, nausea or vomiting.   Past Medical History  Diagnosis Date  . Hypertension   . Colon polyp   . BPH (benign prostatic hypertrophy)   . Polycythemia   . Kidney stones   . Generalized headaches   . Arthritis   . Hemochromatosis 08/22/2014  . Cancer     colon  . Cancer     throat-daignosed Nov 2013-radiation tx     Surgical History:  Past Surgical History  Procedure Laterality Date  . Colon surgery  2005    colon cancer  . Hand surgery  2009    left hand, ?carpal tunnel release  . Hernia repair  2005    ?inguinal   times 3  . Mass biopsy  12/25/2011    Procedure: NECK MASS BIOPSY;  Surgeon: Ruby Cola, MD;  Location: [facility];  Service: ENT;  Laterality: Left;  Left open neck BX  . Colonoscopy w/ polypectomy       Prescriptions prior to admission  Medication Sig Dispense Refill Last Dose  . aspirin EC 81 MG tablet Take 81 mg by mouth daily.    Past Week at Unknown time  . atenolol (TENORMIN) 25 MG tablet Take  25 mg by mouth 2 (two) times daily.   10/01/2014 at 0630  . tamsulosin (FLOMAX) 0.4 MG CAPS capsule TAKE 1 CAPSULE BY MOUTH ONCE DAILY 30 capsule 5 10/01/2014 at Unknown time  . [DISCONTINUED] atenolol (TENORMIN) 25 MG tablet Take 1 tablet (25 mg total) by mouth 2 (two) times daily. 60 tablet 5 10/01/2014 at Unknown time    Inpatient Medications:  . sodium chloride   Intravenous STAT  . antiseptic oral rinse  7 mL Mouth Rinse BID  . aspirin EC  81 mg Oral Daily  . heparin  5,000 Units Subcutaneous 3 times per day  . hydrALAZINE  25 mg Oral TID  . tamsulosin  0.4 mg Oral Daily    Allergies:  Allergies  Allergen Reactions  . Atenolol     Severe bradycardia    History   Social History  . Marital Status: Married    Spouse Name: N/A  . Number of Children: 2  . Years of Education: N/A   Occupational History  . Retired    Social History Main Topics  . Smoking status: Never Smoker   . Smokeless tobacco: Never Used     Comment: never used tobacco  . Alcohol Use: 0.0 oz/week    0 Standard drinks or equivalent per week     Comment: RARELY  . Drug Use:  No  . Sexual Activity: Not on file   Other Topics Concern  . Not on file   Social History Narrative   CAFFEINE USE:  1 cup coffee daily   Regular exercise:  5 x weekly   Married for 1 year to a woman who is 59.     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            Family History  Problem Relation Age of Onset  . Heart disease Mother     due to rheumatic fever  . Colon cancer Neg Hx   . Colon polyps Neg Hx   . Kidney disease Neg Hx   . Diabetes Neg Hx      Review of Systems: All other systems reviewed and are otherwise negative except as noted above.  Physical Exam: Filed Vitals:   10/02/14 0043 10/02/14 0335 10/02/14 0745 10/02/14 0800  BP: 165/90 140/56 151/68 155/77  Pulse:  63 31   Temp:  98 F (36.7 C) 98.1 F (36.7 C)   TempSrc:  Oral Oral   Resp: 20 22 20 21     Height:      Weight:      SpO2:  96% 99%     GEN- The patient is elderly appearing, alert and oriented x 3 today.   HEENT: normocephalic, atraumatic; sclera clear, conjunctiva pink; hearing intact; oropharynx clear; neck supple  Lymph- no cervical lymphadenopathy Lungs- Clear to ausculation bilaterally, normal work of breathing.  No wheezes, rales, rhonchi Heart- Bradycardic regular rate and rhythm, no murmurs, rubs or gallops  GI- soft, non-tender, non-distended, bowel sounds present  Extremities- no clubbing, cyanosis, or edema; DP/PT/radial pulses 2+ bilaterally MS- no significant deformity or atrophy Skin- warm and dry, no rash or lesion Psych- euthymic mood, full affect Neuro- strength and sensation are intact  Labs:   Lab Results  Component Value Date   WBC 7.6 10/01/2014   HGB 14.5 10/01/2014   HCT 42.9 10/01/2014   MCV 93.9 10/01/2014   PLT 144* 10/01/2014    Recent Labs Lab 10/01/14 1350 10/01/14 1857  NA 140  --   K 4.0  --   CL 102  --   CO2 27  --   BUN 24*  --   CREATININE 1.37* 1.35*  CALCIUM 9.5  --   PROT 7.5  --   BILITOT 0.6  --   ALKPHOS 93  --   ALT 31  --   AST 32  --   GLUCOSE 96  --       Radiology/Studies: Dg Chest Port 1 View 10/01/2014   CLINICAL DATA:  Bradycardia  EXAM: PORTABLE CHEST - 1 VIEW  COMPARISON:  12/22/2011  FINDINGS: Chronic interstitial markings. No focal consolidation. No pleural effusion or pneumothorax.  Cardiomegaly.  IMPRESSION: No evidence of acute cardiopulmonary disease.   Electronically Signed   By: Julian Hy M.D.   On: 10/01/2014 14:16    EKG:2:1 heart block, ventricular rate 36, RBBB  TELEMETRY: 2:1 heart block  Assessment/Plan: 1.  2:1 heart block The patient presents with symptomatic 2:1 heart block.  His Atenolol is being held without improvement so far in conduction. Will continue to hold Atenolol today and re-evaluate rhythm after lunch (>5 half lives).  If persistent heart block at that time,  will proceed with pacemaker implantation. Risks, benefits reviewed with the patient who wishes to proceed.  Echo pending this morning   2.  HTN Continue to hold BB Will resume after pacemaker implantation  3.  Acute renal insufficiency Likely related to heart block Will hydrate gently this morning  Signed, [name], NP 10/02/2014 8:54 AM  I have seen, examined the patient, and reviewed the above assessment and plan.  On exam, comfortable but quite bradycardic. Changes to above are made where necessary.  I would therefore recommend pacemaker implantation at this time.  Risks, benefits, alternatives to pacemaker implantation were discussed in detail with the patient today. The patient understands that the risks include but are not limited to bleeding, infection, pneumothorax, perforation, tamponade, vascular damage, renal failure, MI, stroke, death,  and lead dislodgement and wishes to proceed. We will therefore schedule the procedure at the next available time.   Co Sign: [name], MD 10/02/2014 3:03 PM

## 2014-10-02 NOTE — H&P (View-Only) (Signed)
ELECTROPHYSIOLOGY CONSULT NOTE    Patient ID: Brandon Hensley MRN: [id number], DOB/AGE: 06/30/1926 81 y.o.  Admit date: 10/01/2014 Date of Consult: 10/02/2014  Primary Physician: Nance Pear., NP Primary Cardiologist: new this admission - [place]  Reason for Consultation: heart block  HPI:  Brandon Hensley is a 81 y.o. male with a past medical history significant for hypertension, colon cancer, BPH, and polycythemia.  He has been maintained on Atenolol for hypertension. He presented yesterday with weakness and exertional pre-syncope.  This has been preceded by profound fatigue in the last 2 days.  On arrival, he was found to be in 2:1 heart block.  His atenolol has been held without improvement in conduction.  EP has been asked to evaluate for treatment options.  Echo is pending this admission.  Prior to the presentation of symptoms 3 days ago, the patient denies chest pain, shortness of breath, LE edema, palpitations, dizziness, recent fevers, chills, nausea or vomiting.   Past Medical History  Diagnosis Date  . Hypertension   . Colon polyp   . BPH (benign prostatic hypertrophy)   . Polycythemia   . Kidney stones   . Generalized headaches   . Arthritis   . Hemochromatosis 08/22/2014  . Cancer     colon  . Cancer     throat-daignosed Nov 2013-radiation tx     Surgical History:  Past Surgical History  Procedure Laterality Date  . Colon surgery  2005    colon cancer  . Hand surgery  2009    left hand, ?carpal tunnel release  . Hernia repair  2005    ?inguinal   times 3  . Mass biopsy  12/25/2011    Procedure: NECK MASS BIOPSY;  Surgeon: Ruby Cola, MD;  Location: [facility];  Service: ENT;  Laterality: Left;  Left open neck BX  . Colonoscopy w/ polypectomy       Prescriptions prior to admission  Medication Sig Dispense Refill Last Dose  . aspirin EC 81 MG tablet Take 81 mg by mouth daily.    Past Week at Unknown time  . atenolol (TENORMIN) 25 MG tablet Take  25 mg by mouth 2 (two) times daily.   10/01/2014 at 0630  . tamsulosin (FLOMAX) 0.4 MG CAPS capsule TAKE 1 CAPSULE BY MOUTH ONCE DAILY 30 capsule 5 10/01/2014 at Unknown time  . [DISCONTINUED] atenolol (TENORMIN) 25 MG tablet Take 1 tablet (25 mg total) by mouth 2 (two) times daily. 60 tablet 5 10/01/2014 at Unknown time    Inpatient Medications:  . sodium chloride   Intravenous STAT  . antiseptic oral rinse  7 mL Mouth Rinse BID  . aspirin EC  81 mg Oral Daily  . heparin  5,000 Units Subcutaneous 3 times per day  . hydrALAZINE  25 mg Oral TID  . tamsulosin  0.4 mg Oral Daily    Allergies:  Allergies  Allergen Reactions  . Atenolol     Severe bradycardia    History   Social History  . Marital Status: Married    Spouse Name: N/A  . Number of Children: 2  . Years of Education: N/A   Occupational History  . Retired    Social History Main Topics  . Smoking status: Never Smoker   . Smokeless tobacco: Never Used     Comment: never used tobacco  . Alcohol Use: 0.0 oz/week    0 Standard drinks or equivalent per week     Comment: RARELY  . Drug Use:  No  . Sexual Activity: Not on file   Other Topics Concern  . Not on file   Social History Narrative   CAFFEINE USE:  1 cup coffee daily   Regular exercise:  5 x weekly   Married for 1 year to a woman who is 79.     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            Family History  Problem Relation Age of Onset  . Heart disease Mother     due to rheumatic fever  . Colon cancer Neg Hx   . Colon polyps Neg Hx   . Kidney disease Neg Hx   . Diabetes Neg Hx      Review of Systems: All other systems reviewed and are otherwise negative except as noted above.  Physical Exam: Filed Vitals:   10/02/14 0043 10/02/14 0335 10/02/14 0745 10/02/14 0800  BP: 165/90 140/56 151/68 155/77  Pulse:  63 31   Temp:  98 F (36.7 C) 98.1 F (36.7 C)   TempSrc:  Oral Oral   Resp: 20 22 20 21     Height:      Weight:      SpO2:  96% 99%     GEN- The patient is elderly appearing, alert and oriented x 3 today.   HEENT: normocephalic, atraumatic; sclera clear, conjunctiva pink; hearing intact; oropharynx clear; neck supple  Lymph- no cervical lymphadenopathy Lungs- Clear to ausculation bilaterally, normal work of breathing.  No wheezes, rales, rhonchi Heart- Bradycardic regular rate and rhythm, no murmurs, rubs or gallops  GI- soft, non-tender, non-distended, bowel sounds present  Extremities- no clubbing, cyanosis, or edema; DP/PT/radial pulses 2+ bilaterally MS- no significant deformity or atrophy Skin- warm and dry, no rash or lesion Psych- euthymic mood, full affect Neuro- strength and sensation are intact  Labs:   Lab Results  Component Value Date   WBC 7.6 10/01/2014   HGB 14.5 10/01/2014   HCT 42.9 10/01/2014   MCV 93.9 10/01/2014   PLT 144* 10/01/2014    Recent Labs Lab 10/01/14 1350 10/01/14 1857  NA 140  --   K 4.0  --   CL 102  --   CO2 27  --   BUN 24*  --   CREATININE 1.37* 1.35*  CALCIUM 9.5  --   PROT 7.5  --   BILITOT 0.6  --   ALKPHOS 93  --   ALT 31  --   AST 32  --   GLUCOSE 96  --       Radiology/Studies: Dg Chest Port 1 View 10/01/2014   CLINICAL DATA:  Bradycardia  EXAM: PORTABLE CHEST - 1 VIEW  COMPARISON:  12/22/2011  FINDINGS: Chronic interstitial markings. No focal consolidation. No pleural effusion or pneumothorax.  Cardiomegaly.  IMPRESSION: No evidence of acute cardiopulmonary disease.   Electronically Signed   By: Julian Hy M.D.   On: 10/01/2014 14:16    EKG:2:1 heart block, ventricular rate 36, RBBB  TELEMETRY: 2:1 heart block  Assessment/Plan: 1.  2:1 heart block The patient presents with symptomatic 2:1 heart block.  His Atenolol is being held without improvement so far in conduction. Will continue to hold Atenolol today and re-evaluate rhythm after lunch (>5 half lives).  If persistent heart block at that time,  will proceed with pacemaker implantation. Risks, benefits reviewed with the patient who wishes to proceed.  Echo pending this morning   2.  HTN Continue to hold BB Will resume after pacemaker implantation  3.  Acute renal insufficiency Likely related to heart block Will hydrate gently this morning  Signed, [name], NP 10/02/2014 8:54 AM  I have seen, examined the patient, and reviewed the above assessment and plan.  On exam, comfortable but quite bradycardic. Changes to above are made where necessary.  I would therefore recommend pacemaker implantation at this time.  Risks, benefits, alternatives to pacemaker implantation were discussed in detail with the patient today. The patient understands that the risks include but are not limited to bleeding, infection, pneumothorax, perforation, tamponade, vascular damage, renal failure, MI, stroke, death,  and lead dislodgement and wishes to proceed. We will therefore schedule the procedure at the next available time.   Co Sign: [name], MD 10/02/2014 3:03 PM

## 2014-10-02 NOTE — Progress Notes (Deleted)
Error

## 2014-10-02 NOTE — Progress Notes (Signed)
Echocardiogram 2D Echocardiogram has been performed.  Brandon Hensley 10/02/2014, 10:10 AM

## 2014-10-03 ENCOUNTER — Inpatient Hospital Stay (HOSPITAL_COMMUNITY): Payer: Medicare Other

## 2014-10-03 ENCOUNTER — Encounter (HOSPITAL_COMMUNITY): Payer: Self-pay | Admitting: Internal Medicine

## 2014-10-03 LAB — BASIC METABOLIC PANEL
Anion gap: 11 (ref 5–15)
BUN: 15 mg/dL (ref 6–20)
CO2: 24 mmol/L (ref 22–32)
CREATININE: 1.32 mg/dL — AB (ref 0.61–1.24)
Calcium: 9 mg/dL (ref 8.9–10.3)
Chloride: 100 mmol/L — ABNORMAL LOW (ref 101–111)
GFR calc Af Amer: 55 mL/min — ABNORMAL LOW (ref >60)
GFR calc non Af Amer: 47 mL/min — ABNORMAL LOW (ref >60)
GLUCOSE: 168 mg/dL — AB (ref 65–99)
Potassium: 4.1 mmol/L (ref 3.5–5.1)
Sodium: 135 mmol/L (ref 135–145)

## 2014-10-03 MED FILL — Lidocaine HCl Local Preservative Free (PF) Inj 1%: INTRAMUSCULAR | Qty: 30 | Status: AC

## 2014-10-03 NOTE — Discharge Summary (Signed)
ELECTROPHYSIOLOGY PROCEDURE DISCHARGE SUMMARY    Patient ID: Brandon Hensley,  MRN: [id number], DOB/AGE: 1927/04/21 81 y.o.  Admit date: 10/01/2014 Discharge date: 10/03/2014  Primary Care Physician: Nance Pear., NP Primary Cardiologist: Meda Coffee (new this admission) Electrophysiologist: Primo Innis  Primary Discharge Diagnosis:  Symptomatic 2:1 heart block status post pacemaker implantation this admission  Secondary Discharge Diagnosis:  1.  Hypertension 2.  Colon cancer 3.  BPH 4.  Polycythemia  No Active Allergies   Procedures This Admission:  1.  Implantation of a MDT dual chamber PPM on 10/02/14 by Dr Rayann Heman.  The patient received a MDT MRI compatible model number Advisa DR PPM with model number 5076 right atrial lead and 5076 right ventricular lead. There were no immediate post procedure complications. 2.  CXR on 10/03/14 demonstrated no pneumothorax status post device implantation.   Brief HPI/Hospital Course:  Brandon Hensley is a 81 y.o. male with a past medical history as outlined above.  He presented to the ER for evaluation after a several day history of progressive fatigue and dizziness. He was found to be in 2:1 heart block.  His atenolol was held without improvement in conduction.  Risks, benefits to pacemaker implantation were reviewed with the patient who wished to proceed. Echocardiogram demonstrated normal LV function.  The patient underwent implantation of a MDT dual chamber pacemaker with details as outlined above.  He  was monitored on telemetry overnight which demonstrated sinus rhythm with ventricular pacing.  Left chest was without hematoma or ecchymosis.  The device was interrogated and found to be functioning normally.  CXR was obtained and demonstrated no pneumothorax status post device implantation.  Wound care, arm mobility, and restrictions were reviewed with the patient.  The patient was examined and considered stable for discharge to home.     Physical Exam: Filed Vitals:   10/02/14 1713 10/02/14 2000 10/02/14 2200 10/03/14 0500  BP: 163/85 175/87 165/89 139/81  Pulse: 69 64  73  Temp: 97.6 F (36.4 C) 98.4 F (36.9 C) 98.4 F (36.9 C) 98.7 F (37.1 C)  TempSrc: Oral Oral Oral Oral  Resp: 19 18 16 16   Height: 5\' 9"  (1.753 m)     Weight: 183 lb (83.008 kg)   177 lb 14.4 oz (80.695 kg)  SpO2: 100% 98% 98% 96%    GEN- The patient is elderly appearing, alert and oriented x 3 today.   HEENT: normocephalic, atraumatic; sclera clear, conjunctiva pink; hearing intact; oropharynx clear; neck supple, no JVP Lymph- no cervical lymphadenopathy Lungs- Clear to ausculation bilaterally, normal work of breathing.  No wheezes, rales, rhonchi Heart- Regular rate and rhythm, no murmurs, rubs or gallops  GI- soft, non-tender, non-distended, bowel sounds present  Extremities- no clubbing, cyanosis, or edema; DP/PT/radial pulses 2+ bilaterally MS- no significant deformity or atrophy Skin- warm and dry, no rash or lesion, left chest without hematoma/ecchymosis Psych- euthymic mood, full affect Neuro- strength and sensation are intact   Labs:   Lab Results  Component Value Date   WBC 7.6 10/01/2014   HGB 14.5 10/01/2014   HCT 42.9 10/01/2014   MCV 93.9 10/01/2014   PLT 144* 10/01/2014     Recent Labs Lab 10/01/14 1350 10/01/14 1857  NA 140  --   K 4.0  --   CL 102  --   CO2 27  --   BUN 24*  --   CREATININE 1.37* 1.35*  CALCIUM 9.5  --   PROT 7.5  --  BILITOT 0.6  --   ALKPHOS 93  --   ALT 31  --   AST 32  --   GLUCOSE 96  --     Discharge Medications:    Medication List    TAKE these medications        aspirin EC 81 MG tablet  Take 81 mg by mouth daily.     atenolol 25 MG tablet  Commonly known as:  TENORMIN  Take 25 mg by mouth 2 (two) times daily.     tamsulosin 0.4 MG Caps capsule  Commonly known as:  FLOMAX  TAKE 1 CAPSULE BY MOUTH ONCE DAILY        Disposition:  Discharge Instructions     Diet - low sodium heart healthy    Complete by:  As directed      Increase activity slowly    Complete by:  As directed           Follow-up Information    Follow up with [facility] On 10/15/2014.   Why:  at 3:30PM for wound check    Contact information:   [address] [place] [address]       Follow up with [name], MD On 01/21/2015.   Specialty:  Cardiology   Why:  at 9:45AM   Contact information:   [place] [address] [phone]       Duration of Discharge Encounter: Greater than 30 minutes including physician time.  Signed, [name], NP 10/03/2014 7:16 AM   I have seen, examined the patient, and reviewed the above assessment and plan. On exam, RRR.  Changes to above are made where necessary.   Device interrogation is reviewed and normal.  CXR reveals stable leads, no ptx.  Co Sign: [name], MD 10/03/2014 5:33 PM

## 2014-10-03 NOTE — Discharge Instructions (Signed)
° ° °  Supplemental Discharge Instructions for  Pacemaker/Defibrillator Patients  Activity No heavy lifting or vigorous activity with your left/right arm for 6 to 8 weeks.  Do not raise your left/right arm above your head for one week.  Gradually raise your affected arm as drawn below.           __      10/07/14                   10/08/14                   10/09/14                      10/10/14  NO DRIVING for  1 week   ; you may begin driving on   5/0/27  .  WOUND CARE - Keep the wound area clean and dry.  Do not get this area wet for one week. No showers for one week; you may shower on  10/10/14   . - The tape/steri-strips on your wound will fall off; do not pull them off.  No bandage is needed on the site.  DO  NOT apply any creams, oils, or ointments to the wound area. - If you notice any drainage or discharge from the wound, any swelling or bruising at the site, or you develop a fever > 101? F after you are discharged home, call the office at once.  Special Instructions - You are still able to use cellular telephones; use the ear opposite the side where you have your pacemaker/defibrillator.  Avoid carrying your cellular phone near your device. - When traveling through airports, show security personnel your identification card to avoid being screened in the metal detectors.  Ask the security personnel to use the hand wand. - Avoid arc welding equipment, MRI testing (magnetic resonance imaging), TENS units (transcutaneous nerve stimulators).  Call the office for questions about other devices. - Avoid electrical appliances that are in poor condition or are not properly grounded. - Microwave ovens are safe to be near or to operate.

## 2014-10-04 ENCOUNTER — Telehealth: Payer: Self-pay | Admitting: *Deleted

## 2014-10-04 NOTE — Telephone Encounter (Signed)
No, since his admission was purely cardiac, I don't need to see him. Thanks.

## 2014-10-04 NOTE — Telephone Encounter (Signed)
Patient was hospitalized for heart block with pacemaker implantation- discharge instructions state to f/u with Dr. Rayann Heman, cardiology- would you like for him to follow-up here as well?

## 2014-10-05 ENCOUNTER — Encounter: Payer: Self-pay | Admitting: Cardiology

## 2014-10-11 ENCOUNTER — Telehealth: Payer: Self-pay | Admitting: Internal Medicine

## 2014-10-11 NOTE — Telephone Encounter (Signed)
New message      1. Has your device fired? no 2. Is you device beeping? no  3. Are you experiencing draining or swelling at device site? Pt not sure if it is swollen  4. Are you calling to see if we received your device transmission? no 5. Have you passed out? no Bandage still on and there are bruises around pacemaker area.  Pt had pacemaker put in last wed.  He is concerned that the bandages are still on and maybe someone may want to look at it.  Please call

## 2014-10-11 NOTE — Telephone Encounter (Signed)
Instructed pt that strips would be removed at wound check appointment on 10-15-14. Pt aware and also aware of appt on 10-15-14.

## 2014-10-15 ENCOUNTER — Ambulatory Visit (INDEPENDENT_AMBULATORY_CARE_PROVIDER_SITE_OTHER): Payer: Medicare Other | Admitting: *Deleted

## 2014-10-15 DIAGNOSIS — I4430 Unspecified atrioventricular block: Secondary | ICD-10-CM

## 2014-10-15 DIAGNOSIS — Z950 Presence of cardiac pacemaker: Secondary | ICD-10-CM

## 2014-10-15 DIAGNOSIS — I441 Atrioventricular block, second degree: Secondary | ICD-10-CM | POA: Diagnosis not present

## 2014-10-15 DIAGNOSIS — R001 Bradycardia, unspecified: Secondary | ICD-10-CM | POA: Diagnosis not present

## 2014-10-15 LAB — CUP PACEART INCLINIC DEVICE CHECK
Battery Voltage: 3.14 V
Brady Statistic AP VS Percent: 0 %
Brady Statistic AS VP Percent: 49.14 %
Brady Statistic RV Percent Paced: 99.99 %
Date Time Interrogation Session: 20160613155130
Lead Channel Impedance Value: 361 Ohm
Lead Channel Impedance Value: 418 Ohm
Lead Channel Pacing Threshold Amplitude: 0.5 V
Lead Channel Pacing Threshold Pulse Width: 0.4 ms
Lead Channel Sensing Intrinsic Amplitude: 2.125 mV
Lead Channel Sensing Intrinsic Amplitude: 2.5 mV
Lead Channel Sensing Intrinsic Amplitude: 5.125 mV
Lead Channel Setting Pacing Amplitude: 3.5 V
Lead Channel Setting Pacing Amplitude: 3.5 V
MDC IDC MSMT LEADCHNL RA PACING THRESHOLD AMPLITUDE: 0.5 V
MDC IDC MSMT LEADCHNL RV IMPEDANCE VALUE: 494 Ohm
MDC IDC MSMT LEADCHNL RV IMPEDANCE VALUE: 627 Ohm
MDC IDC MSMT LEADCHNL RV PACING THRESHOLD PULSEWIDTH: 0.4 ms
MDC IDC SET LEADCHNL RV PACING PULSEWIDTH: 0.4 ms
MDC IDC SET LEADCHNL RV SENSING SENSITIVITY: 4 mV
MDC IDC SET ZONE DETECTION INTERVAL: 400 ms
MDC IDC STAT BRADY AP VP PERCENT: 50.86 %
MDC IDC STAT BRADY AS VS PERCENT: 0.01 %
MDC IDC STAT BRADY RA PERCENT PACED: 50.86 %
Zone Setting Detection Interval: 400 ms

## 2014-10-15 NOTE — Progress Notes (Signed)
Wound check appointment. Steri-strips removed. Wound without redness or edema. Incision edges approximated, wound well healed. Normal device function. Thresholds, sensing, and impedances consistent with implant measurements. Device programmed at 3.5V/auto capture programmed on for extra safety margin until 3 month visit. Histogram distribution appropriate for patient and level of activity. No mode switches or high ventricular rates noted. Patient educated about wound care, arm mobility, lifting restrictions. ROV w/ JA 01/21/15.

## 2014-10-24 ENCOUNTER — Encounter: Payer: Self-pay | Admitting: Family

## 2014-10-24 ENCOUNTER — Ambulatory Visit (HOSPITAL_BASED_OUTPATIENT_CLINIC_OR_DEPARTMENT_OTHER): Payer: Medicare Other | Admitting: Family

## 2014-10-24 ENCOUNTER — Other Ambulatory Visit (HOSPITAL_BASED_OUTPATIENT_CLINIC_OR_DEPARTMENT_OTHER): Payer: Medicare Other

## 2014-10-24 DIAGNOSIS — D45 Polycythemia vera: Secondary | ICD-10-CM | POA: Diagnosis not present

## 2014-10-24 LAB — CBC WITH DIFFERENTIAL (CANCER CENTER ONLY)
BASO#: 0 10*3/uL (ref 0.0–0.2)
BASO%: 0.2 % (ref 0.0–2.0)
EOS ABS: 0.3 10*3/uL (ref 0.0–0.5)
EOS%: 4.1 % (ref 0.0–7.0)
HEMATOCRIT: 47.4 % (ref 38.7–49.9)
HGB: 16.4 g/dL (ref 13.0–17.1)
LYMPH#: 1.5 10*3/uL (ref 0.9–3.3)
LYMPH%: 18.4 % (ref 14.0–48.0)
MCH: 32.7 pg (ref 28.0–33.4)
MCHC: 34.6 g/dL (ref 32.0–35.9)
MCV: 95 fL (ref 82–98)
MONO#: 1 10*3/uL — ABNORMAL HIGH (ref 0.1–0.9)
MONO%: 12.7 % (ref 0.0–13.0)
NEUT#: 5.2 10*3/uL (ref 1.5–6.5)
NEUT%: 64.6 % (ref 40.0–80.0)
Platelets: 140 10*3/uL — ABNORMAL LOW (ref 145–400)
RBC: 5.01 10*6/uL (ref 4.20–5.70)
RDW: 13.1 % (ref 11.1–15.7)
WBC: 8.1 10*3/uL (ref 4.0–10.0)

## 2014-10-24 LAB — COMPREHENSIVE METABOLIC PANEL
ALT: 15 U/L (ref 0–53)
AST: 22 U/L (ref 0–37)
Albumin: 4.3 g/dL (ref 3.5–5.2)
Alkaline Phosphatase: 86 U/L (ref 39–117)
BUN: 22 mg/dL (ref 6–23)
CALCIUM: 9.7 mg/dL (ref 8.4–10.5)
CO2: 30 meq/L (ref 19–32)
CREATININE: 1.24 mg/dL (ref 0.50–1.35)
Chloride: 101 mEq/L (ref 96–112)
Glucose, Bld: 97 mg/dL (ref 70–99)
Potassium: 4.9 mEq/L (ref 3.5–5.3)
Sodium: 140 mEq/L (ref 135–145)
Total Bilirubin: 0.6 mg/dL (ref 0.2–1.2)
Total Protein: 6.9 g/dL (ref 6.0–8.3)

## 2014-10-24 LAB — IRON AND TIBC CHCC
%SAT: 37 % (ref 20–55)
IRON: 116 ug/dL (ref 42–163)
TIBC: 314 ug/dL (ref 202–409)
UIBC: 197 ug/dL (ref 117–376)

## 2014-10-24 LAB — FERRITIN CHCC: Ferritin: 24 ng/ml (ref 22–316)

## 2014-10-24 NOTE — Progress Notes (Signed)
Hematology and Oncology Follow Up Visit  Brandon Hensley [id number] 09-19-1926 81 y.o. 10/24/2014   Principle Diagnosis:  Hemochromatosis (heterozygous for H63D mutation)  Current Therapy:   Phlebotomy to maintain ferritin below 100    Interim History: Brandon Hensley is here today for a follow-up. He is doing well and is asymptomatic at this time. His last phlebotomy was in January. His last ferritin in April was 30.  He had a pacemaker placed on 6/1 and did well with the procedure. He still isn't supposed to lift his left arm up very much.  He still has some residual hoarseness when talking possibly due to the radiation a received for head and neck cancer several years ago. He has no problem with swallowing.  He denies fever, chills, n/v, cough, rash, headache, dizziness, blurred vision, SOB, chest pain, palpitations, abdominal pain, constipation, diarrhea, blood in urine or stool.  No episodes of bleeding or bruising. No lymphadenopathy found on exam.  His appetite is good and he drinks lots of water. His weight is stable. No swelling, tenderness, numbness or tingling in his extremities. No new aches or pains.   Medications:    Medication List       This list is accurate as of: 10/24/14 11:50 AM.  Always use your most recent med list.               aspirin EC 81 MG tablet  Take 81 mg by mouth daily.     atenolol 25 MG tablet  Commonly known as:  TENORMIN  Take 25 mg by mouth 2 (two) times daily.     tamsulosin 0.4 MG Caps capsule  Commonly known as:  FLOMAX  TAKE 1 CAPSULE BY MOUTH ONCE DAILY        Allergies: No Active Allergies  Past Medical History, Surgical history, Social history, and Family History were reviewed and updated.  Review of Systems: All other 10 point review of systems is negative.   Physical Exam:  height is 5\' 9"  (1.753 m) and weight is 189 lb (85.73 kg). His oral temperature is 98 F (36.7 C). His blood pressure is 165/87 and his pulse is  60. His respiration is 18.   Wt Readings from Last 3 Encounters:  10/24/14 189 lb (85.73 kg)  10/03/14 177 lb 14.4 oz (80.695 kg)  09/14/14 186 lb 3.2 oz (84.46 kg)    Ocular: Sclerae unicteric, pupils equal, round and reactive to light Ear-nose-throat: Oropharynx clear, dentition fair Lymphatic: No cervical or supraclavicular adenopathy Lungs no rales or rhonchi, good excursion bilaterally Heart regular rate and rhythm, no murmur appreciated Abd soft, nontender, positive bowel sounds MSK no focal spinal tenderness, no joint edema Neuro: non-focal, well-oriented, appropriate affect Breasts: Deferred  Lab Results  Component Value Date   WBC 8.1 10/24/2014   HGB 16.4 10/24/2014   HCT 47.4 10/24/2014   MCV 95 10/24/2014   PLT 140* 10/24/2014   Lab Results  Component Value Date   FERRITIN 30 08/22/2014   IRON 165* 08/22/2014   TIBC 293 08/22/2014   UIBC 127 08/22/2014   IRONPCTSAT 56* 08/22/2014   Lab Results  Component Value Date   RETICCTPCT 1.57 03/11/2011   RBC 5.01 10/24/2014   RETICCTABS 81.33 03/11/2011   No results found for: KPAFRELGTCHN, LAMBDASER, KAPLAMBRATIO No results found for: IGGSERUM, IGA, IGMSERUM No results found for: TOTALPROTELP, ALBUMINELP, A1GS, A2GS, BETS, BETA2SER, GAMS, MSPIKE, SPEI   Chemistry      Component Value Date/Time   NA 135  10/03/2014 0619   NA 140 10/03/2012 0925   NA 142 07/27/2008 0841   K 4.1 10/03/2014 0619   K 4.2 10/03/2012 0925   K 4.6 07/27/2008 0841   CL 100* 10/03/2014 0619   CL 102 10/03/2012 0925   CL 99 07/27/2008 0841   CO2 24 10/03/2014 0619   CO2 30* 10/03/2012 0925   CO2 29 07/27/2008 0841   BUN 15 10/03/2014 0619   BUN 11.5 10/03/2012 0925   BUN 17 07/27/2008 0841   CREATININE 1.32* 10/03/2014 0619   CREATININE 1.07 10/16/2013 0913   CREATININE 1.1 10/03/2012 0925      Component Value Date/Time   CALCIUM 9.0 10/03/2014 0619   CALCIUM 9.5 10/03/2012 0925   CALCIUM 9.2 07/27/2008 0841   ALKPHOS 93  10/01/2014 1350   ALKPHOS 87 10/03/2012 0925   ALKPHOS 91* 07/27/2008 0841   AST 32 10/01/2014 1350   AST 20 10/03/2012 0925   AST 25 07/27/2008 0841   ALT 31 10/01/2014 1350   ALT 16 10/03/2012 0925   ALT 24 07/27/2008 0841   BILITOT 0.6 10/01/2014 1350   BILITOT 0.77 10/03/2012 0925   BILITOT 1.00 07/27/2008 0841     Impression and Plan: Brandon Hensley is 81 year old gentleman with hemochromatosis. [name] at this time. His ferritin in April was 30. His CBC today looks good. We will see what his iron studies show.  We do not need to phlebotomize him.   We will see him back in 3 months for labs and follow-up.  He knows to call here with any questions or concerns. We can certainly see him sooner if need be.   [name], NP 6/22/201611:50 AM

## 2014-11-07 ENCOUNTER — Encounter: Payer: Self-pay | Admitting: Internal Medicine

## 2014-11-15 ENCOUNTER — Ambulatory Visit: Payer: Medicare Other | Admitting: Family

## 2014-11-16 ENCOUNTER — Ambulatory Visit: Payer: Medicare Other | Admitting: Family

## 2014-12-01 ENCOUNTER — Other Ambulatory Visit: Payer: Self-pay | Admitting: Family

## 2014-12-07 ENCOUNTER — Telehealth: Payer: Self-pay | Admitting: Internal Medicine

## 2014-12-07 NOTE — Telephone Encounter (Signed)
Left message for patient to call back  

## 2014-12-11 NOTE — Telephone Encounter (Signed)
Left message for patient to call back  

## 2014-12-12 NOTE — Telephone Encounter (Signed)
Patient had questions about the cost of a colonoscopy for a uninsured friend.  All questions answered.  He is asked to have his friend call to schedule if she is interested.  He is also provided the phone number for the friend to contact billing about setting up a payment plan or applying for the cone assistance

## 2015-01-21 ENCOUNTER — Ambulatory Visit (INDEPENDENT_AMBULATORY_CARE_PROVIDER_SITE_OTHER): Payer: Medicare Other | Admitting: Internal Medicine

## 2015-01-21 ENCOUNTER — Encounter: Payer: Self-pay | Admitting: Internal Medicine

## 2015-01-21 VITALS — BP 130/84 | HR 60 | Ht 69.0 in | Wt 190.4 lb

## 2015-01-21 DIAGNOSIS — R001 Bradycardia, unspecified: Secondary | ICD-10-CM | POA: Diagnosis not present

## 2015-01-21 DIAGNOSIS — I442 Atrioventricular block, complete: Secondary | ICD-10-CM

## 2015-01-21 DIAGNOSIS — I10 Essential (primary) hypertension: Secondary | ICD-10-CM | POA: Diagnosis not present

## 2015-01-21 DIAGNOSIS — I4430 Unspecified atrioventricular block: Secondary | ICD-10-CM | POA: Diagnosis not present

## 2015-01-21 DIAGNOSIS — I441 Atrioventricular block, second degree: Secondary | ICD-10-CM

## 2015-01-21 NOTE — Progress Notes (Signed)
Electrophysiology Office Note   Date:  01/21/2015   ID:  Brandon Hensley, DOB [birth year], MRN [id number]  PCP:  Nance Pear., NP  Cardiologist:  Dr Meda Coffee Primary Electrophysiologist: [name], MD    Chief Complaint  Patient presents with  . Mobitz II AV block     History of Present Illness: Brandon Hensley is a 81 y.o. male who presents today for electrophysiology evaluation.   Doing well s/p PPM implantation. Still has a little exertional fatigue.  Today, he denies symptoms of palpitations, chest pain, shortness of breath, orthopnea, PND, lower extremity edema, claudication, dizziness, presyncope, syncope, bleeding, or neurologic sequela. The patient is tolerating medications without difficulties and is otherwise without complaint today.    Past Medical History  Diagnosis Date  . Hypertension   . Colon polyp   . BPH (benign prostatic hypertrophy)   . Polycythemia   . Kidney stones   . Generalized headaches   . Arthritis   . Hemochromatosis 08/22/2014  . Cancer     colon  . Cancer     throat-daignosed Nov 2013-radiation tx  . Complete heart block 10/02/2014    s/p PPM   Past Surgical History  Procedure Laterality Date  . Colon surgery  2005    colon cancer  . Hand surgery  2009    left hand, ?carpal tunnel release  . Hernia repair  2005    ?inguinal   times 3  . Mass biopsy  12/25/2011    Procedure: NECK MASS BIOPSY;  Surgeon: Ruby Cola, MD;  Location: [facility];  Service: ENT;  Laterality: Left;  Left open neck BX  . Colonoscopy w/ polypectomy    . Ep implantable device N/A 10/02/2014    Medtronic Advisa DR MRI compatable pacemaker implanted for AV block     Current Outpatient Prescriptions  Medication Sig Dispense Refill  . aspirin EC 81 MG tablet Take 81 mg by mouth daily.     Marland Kitchen atenolol (TENORMIN) 25 MG tablet Take 25 mg by mouth 2 (two) times daily.    . tamsulosin (FLOMAX) 0.4 MG CAPS capsule TAKE 1 CAPSULE BY MOUTH ONCE DAILY 30 capsule  5   No current facility-administered medications for this visit.    Allergies:   Review of patient's allergies indicates no known allergies.   Social History:  The patient  reports that he has never smoked. He has never used smokeless tobacco. He reports that he drinks alcohol. He reports that he does not use illicit drugs.   Family History:  The patient's family history includes Heart disease in his mother. There is no history of Colon cancer, Colon polyps, Kidney disease, or Diabetes.    ROS:  Please see the history of present illness.   All other systems are reviewed and negative.    PHYSICAL EXAM: VS:  BP 130/84 mmHg  Pulse 60  Ht 5\' 9"  (1.753 m)  Wt 190 lb 6.4 oz (86.365 kg)  BMI 28.10 kg/m2 , BMI Body mass index is 28.1 kg/(m^2). GEN: Well nourished, well developed, in no acute distress HEENT: normal Neck: no JVD, carotid bruits, or masses Cardiac: RRR; no murmurs, rubs, or gallops,no edema  Respiratory:  clear to auscultation bilaterally, normal work of breathing GI: soft, nontender, nondistended, + BS MS: no deformity or atrophy Skin: warm and dry, device pocket is well healed Neuro:  Strength and sensation are intact Psych: euthymic mood, full affect  EKG:  EKG is ordered today. The ekg ordered today  shows AV paced rhythm  Device interrogation is reviewed today in detail.  See PaceArt for details.   Recent Labs: 08/22/2014: TSH 6.687* 10/01/2014: B Natriuretic Peptide 317.5* 10/24/2014: ALT 15; BUN 22; Creatinine, Ser 1.24; HGB 16.4; Platelets 140*; Potassium 4.9; Sodium 140    Lipid Panel     Component Value Date/Time   CHOL 161 08/11/2011 0928   TRIG 69 08/11/2011 0928   HDL 53 08/11/2011 0928   CHOLHDL 3.0 08/11/2011 0928   VLDL 14 08/11/2011 0928   LDLCALC 94 08/11/2011 0928     Wt Readings from Last 3 Encounters:  01/21/15 190 lb 6.4 oz (86.365 kg)  10/24/14 189 lb (85.73 kg)  10/03/14 177 lb 14.4 oz (80.695 kg)      Other studies  Reviewed: Additional studies/ records that were reviewed today include: ppm implant note, my initial consult note   ASSESSMENT AND PLAN:  1.  Complete heart block Normal pacemaker function See Pace Art report I have turned rate response on today due to flat historgrams.  Ambulation in the office reveals better rate response to activity and clinically he is a little better carelink  2. HTN Stable No change required today  Follow-up: carelink Return to see EP NP in 1 year  Current medicines are reviewed at length with the patient today.   The patient does not have concerns regarding his medicines.  The following changes were made today:  none   Signed, [name], MD  01/21/2015 10:31 AM     [facility] [address] [place] [address] [phone] (office) [phone] (fax)

## 2015-01-21 NOTE — Patient Instructions (Signed)
Medication Instructions:  Your physician recommends that you continue on your current medications as directed. Please refer to the Current Medication list given to you today.   Labwork: noen ordered  Testing/Procedures: None ordered  Follow-Up: Your physician wants you to follow-up in: 12 months with [name], NP You will receive a reminder letter in the mail two months in advance. If you don't receive a letter, please call our office to schedule the follow-up appointment.   Remote monitoring is used to monitor your Pacemaker  from home. This monitoring reduces the number of office visits required to check your device to one time per year. It allows [place] to keep an eye on the functioning of your device to ensure it is working properly. You are scheduled for a device check from home on 04/22/15. You may send your transmission at any time that day. If you have a wireless device, the transmission will be sent automatically. After your physician reviews your transmission, you will receive a postcard with your next transmission date.    Any Other Special Instructions Will Be Listed Below (If Applicable).

## 2015-01-22 LAB — CUP PACEART INCLINIC DEVICE CHECK
Battery Remaining Longevity: 133 mo
Brady Statistic AS VS Percent: 0 %
Brady Statistic RA Percent Paced: 49.58 %
Brady Statistic RV Percent Paced: 100 %
Date Time Interrogation Session: 20160919141053
Lead Channel Impedance Value: 437 Ohm
Lead Channel Pacing Threshold Amplitude: 0.5 V
Lead Channel Pacing Threshold Amplitude: 0.75 V
Lead Channel Pacing Threshold Pulse Width: 0.4 ms
Lead Channel Sensing Intrinsic Amplitude: 3.25 mV
Lead Channel Setting Pacing Amplitude: 2 V
Lead Channel Setting Pacing Pulse Width: 0.4 ms
MDC IDC MSMT BATTERY VOLTAGE: 3.05 V
MDC IDC MSMT LEADCHNL RA IMPEDANCE VALUE: 380 Ohm
MDC IDC MSMT LEADCHNL RA PACING THRESHOLD PULSEWIDTH: 0.4 ms
MDC IDC MSMT LEADCHNL RV IMPEDANCE VALUE: 494 Ohm
MDC IDC MSMT LEADCHNL RV IMPEDANCE VALUE: 589 Ohm
MDC IDC SET LEADCHNL RV PACING AMPLITUDE: 2.5 V
MDC IDC SET LEADCHNL RV SENSING SENSITIVITY: 4 mV
MDC IDC SET ZONE DETECTION INTERVAL: 400 ms
MDC IDC SET ZONE DETECTION INTERVAL: 400 ms
MDC IDC STAT BRADY AP VP PERCENT: 49.58 %
MDC IDC STAT BRADY AP VS PERCENT: 0 %
MDC IDC STAT BRADY AS VP PERCENT: 50.42 %

## 2015-01-24 ENCOUNTER — Encounter: Payer: Self-pay | Admitting: Family

## 2015-01-24 ENCOUNTER — Other Ambulatory Visit (HOSPITAL_BASED_OUTPATIENT_CLINIC_OR_DEPARTMENT_OTHER): Payer: Medicare Other

## 2015-01-24 ENCOUNTER — Ambulatory Visit (HOSPITAL_BASED_OUTPATIENT_CLINIC_OR_DEPARTMENT_OTHER): Payer: Medicare Other | Admitting: Family

## 2015-01-24 LAB — COMPREHENSIVE METABOLIC PANEL
ALBUMIN: 4 g/dL (ref 3.6–5.1)
ALK PHOS: 81 U/L (ref 40–115)
ALT: 16 U/L (ref 9–46)
AST: 24 U/L (ref 10–35)
BUN: 20 mg/dL (ref 7–25)
CALCIUM: 9.7 mg/dL (ref 8.6–10.3)
CO2: 32 mmol/L — ABNORMAL HIGH (ref 20–31)
Chloride: 101 mmol/L (ref 98–110)
Creatinine, Ser: 1.27 mg/dL — ABNORMAL HIGH (ref 0.70–1.11)
Glucose, Bld: 106 mg/dL — ABNORMAL HIGH (ref 65–99)
Potassium: 4.8 mmol/L (ref 3.5–5.3)
Sodium: 141 mmol/L (ref 135–146)
Total Bilirubin: 0.6 mg/dL (ref 0.2–1.2)
Total Protein: 6.3 g/dL (ref 6.1–8.1)

## 2015-01-24 LAB — CBC WITH DIFFERENTIAL (CANCER CENTER ONLY)
BASO#: 0 10*3/uL (ref 0.0–0.2)
BASO%: 0.4 % (ref 0.0–2.0)
EOS%: 4.8 % (ref 0.0–7.0)
Eosinophils Absolute: 0.4 10*3/uL (ref 0.0–0.5)
HEMATOCRIT: 47.1 % (ref 38.7–49.9)
HGB: 16.2 g/dL (ref 13.0–17.1)
LYMPH#: 1.4 10*3/uL (ref 0.9–3.3)
LYMPH%: 18.4 % (ref 14.0–48.0)
MCH: 32.2 pg (ref 28.0–33.4)
MCHC: 34.4 g/dL (ref 32.0–35.9)
MCV: 94 fL (ref 82–98)
MONO#: 0.9 10*3/uL (ref 0.1–0.9)
MONO%: 11.4 % (ref 0.0–13.0)
NEUT#: 5 10*3/uL (ref 1.5–6.5)
NEUT%: 65 % (ref 40.0–80.0)
Platelets: 140 10*3/uL — ABNORMAL LOW (ref 145–400)
RBC: 5.03 10*6/uL (ref 4.20–5.70)
RDW: 13.4 % (ref 11.1–15.7)
WBC: 7.7 10*3/uL (ref 4.0–10.0)

## 2015-01-24 NOTE — Progress Notes (Signed)
Hematology and Oncology Follow Up Visit  Brandon Hensley [id number] Feb 06, 1927 81 y.o. 01/24/2015   Principle Diagnosis:  Hemochromatosis (heterozygous for H63D mutation)  Current Therapy:   Phlebotomy to maintain ferritin below 100    Interim History: Brandon Hensley is here today for a follow-up. He has had no new issues since we saw him last. He still has some difficulty swallowing occasionally and is hoarse most of the time since he had radiation to the neck in 2013. This is unchanged.  Despite this he does have a good appetite and is making sure to stay well hydrated. His weight is unchanged since his last visit.   His last phlebotomy was in April. His ferritin in June was 24.  He denies fatigue of SOB. No joint pain or swelling.  He denies fever, chills, n/v, cough, rash, headache, dizziness, blurred vision, chest pain, palpitations, abdominal pain, constipation, diarrhea, blood in urine or stool.  No episodes of bleeding or bruising. No lymphadenopathy found on exam.  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 He likes having something to do.   Medications:    Medication List       This list is accurate as of: 01/24/15  1:34 PM.  Always use your most recent med list.               aspirin EC 81 MG tablet  Take 81 mg by mouth daily.     atenolol 25 MG tablet  Commonly known as:  TENORMIN  Take 25 mg by mouth 2 (two) times daily.     tamsulosin 0.4 MG Caps capsule  Commonly known as:  FLOMAX  TAKE 1 CAPSULE BY MOUTH ONCE DAILY        Allergies: No Known Allergies  Past Medical History, Surgical history, Social history, and Family History were reviewed and updated.  Review of Systems: All other 10 point review of systems is negative.   Physical Exam:  height is 5\' 9"  (1.753 m) and weight is 190 lb (86.183 kg). His oral temperature is 97.8 F (36.6 C). His blood pressure is 146/97 and his pulse is 88. His respiration is 16.   Wt Readings from Last 3  Encounters:  01/24/15 190 lb (86.183 kg)  01/21/15 190 lb 6.4 oz (86.365 kg)  10/24/14 189 lb (85.73 kg)    Ocular: Sclerae unicteric, pupils equal, round and reactive to light Ear-nose-throat: Oropharynx clear, dentition fair Lymphatic: No cervical or supraclavicular adenopathy Lungs no rales or rhonchi, good excursion bilaterally Heart regular rate and rhythm, no murmur appreciated Abd soft, nontender, positive bowel sounds MSK no focal spinal tenderness, no joint edema Neuro: non-focal, well-oriented, appropriate affect Breasts: Deferred  Lab Results  Component Value Date   WBC 7.7 01/24/2015   HGB 16.2 01/24/2015   HCT 47.1 01/24/2015   MCV 94 01/24/2015   PLT 140* 01/24/2015   Lab Results  Component Value Date   FERRITIN 24 10/24/2014   IRON 116 10/24/2014   TIBC 314 10/24/2014   UIBC 197 10/24/2014   IRONPCTSAT 37 10/24/2014   Lab Results  Component Value Date   RETICCTPCT 1.57 03/11/2011   RBC 5.03 01/24/2015   RETICCTABS 81.33 03/11/2011   No results found for: KPAFRELGTCHN, LAMBDASER, KAPLAMBRATIO No results found for: IGGSERUM, IGA, IGMSERUM No results found for: TOTALPROTELP, ALBUMINELP, A1GS, A2GS, BETS, BETA2SER, GAMS, MSPIKE, SPEI   Chemistry      Component Value Date/Time   NA 140 10/24/2014 1023   NA 140  10/03/2012 0925   NA 142 07/27/2008 0841   K 4.9 10/24/2014 1023   K 4.2 10/03/2012 0925   K 4.6 07/27/2008 0841   CL 101 10/24/2014 1023   CL 102 10/03/2012 0925   CL 99 07/27/2008 0841   CO2 30 10/24/2014 1023   CO2 30* 10/03/2012 0925   CO2 29 07/27/2008 0841   BUN 22 10/24/2014 1023   BUN 11.5 10/03/2012 0925   BUN 17 07/27/2008 0841   CREATININE 1.24 10/24/2014 1023   CREATININE 1.07 10/16/2013 0913   CREATININE 1.1 10/03/2012 0925      Component Value Date/Time   CALCIUM 9.7 10/24/2014 1023   CALCIUM 9.5 10/03/2012 0925   CALCIUM 9.2 07/27/2008 0841   ALKPHOS 86 10/24/2014 1023   ALKPHOS 87 10/03/2012 0925   ALKPHOS 91*  07/27/2008 0841   AST 22 10/24/2014 1023   AST 20 10/03/2012 0925   AST 25 07/27/2008 0841   ALT 15 10/24/2014 1023   ALT 16 10/03/2012 0925   ALT 24 07/27/2008 0841   BILITOT 0.6 10/24/2014 1023   BILITOT 0.77 10/03/2012 0925   BILITOT 1.00 07/27/2008 0841     Impression and Plan: Brandon Hensley is 81 year old gentleman with hemochromatosis. His last phlebotomy was in April. His responded nicely to this and his ferritin in June was 24.  We will see what his iron studies show. [name] at this time. He will not need to be phlebotomized today.  We will plan to see him back in 3 months for labs and follow-up.  He knows to call here with any questions or concerns. We can certainly see him sooner if need be.   [name], NP 9/22/20161:34 PM

## 2015-01-25 LAB — FERRITIN CHCC: FERRITIN: 37 ng/mL (ref 22–316)

## 2015-01-25 LAB — IRON AND TIBC CHCC
%SAT: 35 % (ref 20–55)
Iron: 98 ug/dL (ref 42–163)
TIBC: 283 ug/dL (ref 202–409)
UIBC: 185 ug/dL (ref 117–376)

## 2015-03-06 ENCOUNTER — Telehealth: Payer: Self-pay | Admitting: Internal Medicine

## 2015-03-06 NOTE — Telephone Encounter (Signed)
Spoke with office as they called back and were transferred to me.  No need for SBE prior to dental cleaning from a PPM standpoint

## 2015-03-06 NOTE — Telephone Encounter (Signed)
New message    1. What dental office are you calling from? penny calling from dr. Lear Ng  2. What is your office phone and fax number? [phone] /  Fax fax [phone]   3. What type of procedure is the patient having performed? Possible extraction    4. What date is procedure scheduled? 11.2.2016 @ 10:30 am  / pt at the office now   5. What is your question (ex. Antibiotics prior to procedure, holding medication-we need to know how long dentist wants pt to hold med)? Does pt needs to be pre-med

## 2015-03-08 ENCOUNTER — Other Ambulatory Visit: Payer: Self-pay | Admitting: *Deleted

## 2015-03-08 DIAGNOSIS — G4733 Obstructive sleep apnea (adult) (pediatric): Secondary | ICD-10-CM

## 2015-03-12 ENCOUNTER — Telehealth: Payer: Self-pay

## 2015-03-12 NOTE — Telephone Encounter (Signed)
Pt called back in, informed him of the below message. Pt says that he's busy at the moment, he will call us back when he have a second to get a wellness visit scheduled.

## 2015-03-12 NOTE — Telephone Encounter (Signed)
Left message AWV

## 2015-04-16 ENCOUNTER — Ambulatory Visit (HOSPITAL_BASED_OUTPATIENT_CLINIC_OR_DEPARTMENT_OTHER): Payer: Self-pay

## 2015-04-22 ENCOUNTER — Ambulatory Visit (INDEPENDENT_AMBULATORY_CARE_PROVIDER_SITE_OTHER): Payer: Medicare Other | Admitting: *Deleted

## 2015-04-22 ENCOUNTER — Telehealth: Payer: Self-pay | Admitting: Internal Medicine

## 2015-04-22 DIAGNOSIS — I442 Atrioventricular block, complete: Secondary | ICD-10-CM

## 2015-04-22 NOTE — Progress Notes (Signed)
Remote pacemaker transmission.   

## 2015-04-22 NOTE — Telephone Encounter (Signed)
LMOVM for pt to return call 

## 2015-04-22 NOTE — Telephone Encounter (Signed)
Spoke w/ pt and assisted him w/ send remote transmission. Transmission successful pt aware.

## 2015-04-22 NOTE — Telephone Encounter (Signed)
NEw MEssage  Pt requested to speak w/ Device concerning his first remote transmission sched for today 12/19. Please call back and discuss.

## 2015-04-23 ENCOUNTER — Telehealth: Payer: Self-pay | Admitting: Behavioral Health

## 2015-04-23 ENCOUNTER — Encounter: Payer: Self-pay | Admitting: Behavioral Health

## 2015-04-23 LAB — CUP PACEART REMOTE DEVICE CHECK
Battery Remaining Longevity: 116 mo
Battery Voltage: 3.03 V
Brady Statistic AP VS Percent: 0 %
Brady Statistic RA Percent Paced: 80.36 %
Brady Statistic RV Percent Paced: 100 %
Date Time Interrogation Session: 20161219210117
Implantable Lead Implant Date: 20160531
Implantable Lead Implant Date: 20160531
Implantable Lead Location: 753859
Implantable Lead Location: 753860
Implantable Lead Model: 5076
Implantable Lead Model: 5076
Lead Channel Impedance Value: 361 Ohm
Lead Channel Impedance Value: 456 Ohm
Lead Channel Impedance Value: 570 Ohm
Lead Channel Pacing Threshold Pulse Width: 0.4 ms
Lead Channel Pacing Threshold Pulse Width: 0.4 ms
Lead Channel Sensing Intrinsic Amplitude: 2.5 mV
Lead Channel Sensing Intrinsic Amplitude: 2.5 mV
Lead Channel Setting Pacing Amplitude: 2 V
Lead Channel Setting Sensing Sensitivity: 4 mV
MDC IDC MSMT LEADCHNL RA IMPEDANCE VALUE: 418 Ohm
MDC IDC MSMT LEADCHNL RA PACING THRESHOLD AMPLITUDE: 0.5 V
MDC IDC MSMT LEADCHNL RV PACING THRESHOLD AMPLITUDE: 0.75 V
MDC IDC MSMT LEADCHNL RV SENSING INTR AMPL: 6.875 mV
MDC IDC MSMT LEADCHNL RV SENSING INTR AMPL: 6.875 mV
MDC IDC SET LEADCHNL RV PACING AMPLITUDE: 2.5 V
MDC IDC SET LEADCHNL RV PACING PULSEWIDTH: 0.4 ms
MDC IDC STAT BRADY AP VP PERCENT: 80.36 %
MDC IDC STAT BRADY AS VP PERCENT: 19.64 %
MDC IDC STAT BRADY AS VS PERCENT: 0 %

## 2015-04-23 NOTE — Telephone Encounter (Signed)
Opened in error

## 2015-04-23 NOTE — Telephone Encounter (Signed)
Pre-Visit Call completed with patient and chart updated.   Pre-Visit Info documented in Specialty Comments under [employer].    

## 2015-04-24 ENCOUNTER — Ambulatory Visit (INDEPENDENT_AMBULATORY_CARE_PROVIDER_SITE_OTHER): Payer: Medicare Other | Admitting: Family

## 2015-04-24 ENCOUNTER — Encounter: Payer: Self-pay | Admitting: Family

## 2015-04-24 VITALS — BP 160/94 | HR 80 | Temp 97.7°F | Resp 16 | Ht 68.0 in | Wt 184.4 lb

## 2015-04-24 DIAGNOSIS — Z0000 Encounter for general adult medical examination without abnormal findings: Secondary | ICD-10-CM

## 2015-04-24 DIAGNOSIS — I10 Essential (primary) hypertension: Secondary | ICD-10-CM

## 2015-04-24 DIAGNOSIS — R739 Hyperglycemia, unspecified: Secondary | ICD-10-CM | POA: Diagnosis not present

## 2015-04-24 LAB — COMPREHENSIVE METABOLIC PANEL
ALBUMIN: 4.1 g/dL (ref 3.5–5.2)
ALT: 17 U/L (ref 0–53)
AST: 23 U/L (ref 0–37)
Alkaline Phosphatase: 88 U/L (ref 39–117)
BUN: 23 mg/dL (ref 6–23)
CO2: 31 mEq/L (ref 19–32)
CREATININE: 1.23 mg/dL (ref 0.40–1.50)
Calcium: 9.8 mg/dL (ref 8.4–10.5)
Chloride: 102 mEq/L (ref 96–112)
GFR: 59.25 mL/min — ABNORMAL LOW (ref >60.00)
GLUCOSE: 98 mg/dL (ref 70–99)
Potassium: 4.9 mEq/L (ref 3.5–5.1)
Sodium: 139 mEq/L (ref 135–145)
TOTAL PROTEIN: 7 g/dL (ref 6.0–8.3)
Total Bilirubin: 0.8 mg/dL (ref 0.2–1.2)

## 2015-04-24 LAB — HEMOGLOBIN A1C: Hgb A1c MFr Bld: 5.5 % (ref 4.6–6.5)

## 2015-04-24 MED ORDER — ATENOLOL 50 MG PO TABS: 50.0000 mg | ORAL_TABLET | Freq: Two times a day (BID) | ORAL | Status: DC

## 2015-04-24 NOTE — Assessment & Plan Note (Signed)
Uncontrolled. Increase atenolol from 25mg  bid to 50mg  bid.

## 2015-04-24 NOTE — Progress Notes (Signed)
Pre visit review using our clinic review tool, if applicable. No additional management support is needed unless otherwise documented below in the visit note. 

## 2015-04-24 NOTE — Progress Notes (Signed)
Subjective:    Brandon Hensley is a 81 y.o. male who presents for Medicare Annual/Subsequent preventive examination.   Preventive Screening-Counseling & Management  Tobacco History  Smoking status  . Never Smoker   Smokeless tobacco  . Never Used    Comment: never used tobacco    Problems Prior to Visit  1. HTN-current BP meds include atenolol (last dose was 2 AM).   BP Readings from Last 3 Encounters:  04/24/15 160/94  01/24/15 146/97  01/21/15 130/84   2.  BPH- maintained on flomax. Reports no urinary problems.   3.  Hx of colon cancer- last colo 2011- no further colo's planned due to advanced age.   4.  Iron overload/hemochromatosis- pt is followed by hematology and is phlebotomized prn.   5.  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    6.  Systolic CHF/Complete heart block- he is s/p PPM 10/02/14.  Current Problems (verified) Patient Active Problem List   Diagnosis Date Noted  . Symptomatic bradycardia 10/01/2014  . Complete heart block ([place]) 10/01/2014  . Hyperlipidemia 10/01/2014  . Acute systolic CHF (congestive heart failure) ([place]) 10/01/2014  . Symptomatic advanced heart block 10/01/2014  . Cerumen impaction 09/14/2014  . Hemochromatosis 08/22/2014  . Routine general medical examination at a health care facility 03/14/2013  . Squamous cell carcinoma of neck ([place]) 03/22/2012  . Cancer of base of tongue ([place]) 03/11/2012  . Carcinoma of base of tongue ([place]) 03/10/2012  . BP (high blood pressure) 02/08/2012  . Cancer of neck ([place]) 02/04/2012  . H/O malignant neoplasm of colon 01/13/2012  . Headache(784.0) 11/10/2011  . Hyperkalemia 08/12/2011  . Polycythemia, secondary 09/05/2007  . Essential hypertension 11/30/2006  . BPH (benign prostatic hyperplasia) 11/30/2006  . COLON CANCER, HX OF 11/30/2006  . Iron overload 02/20/2006    Medications Prior to Visit Current Outpatient Prescriptions on File Prior to Visit  Medication Sig  Dispense Refill  . aspirin EC 81 MG tablet Take 81 mg by mouth daily.     Marland Kitchen atenolol (TENORMIN) 25 MG tablet Take 25 mg by mouth 2 (two) times daily.    . tamsulosin (FLOMAX) 0.4 MG CAPS capsule TAKE 1 CAPSULE BY MOUTH ONCE DAILY 30 capsule 5   No current facility-administered medications on file prior to visit.    Current Medications (verified) Current Outpatient Prescriptions  Medication Sig Dispense Refill  . aspirin EC 81 MG tablet Take 81 mg by mouth daily.     Marland Kitchen atenolol (TENORMIN) 25 MG tablet Take 25 mg by mouth 2 (two) times daily.    . tamsulosin (FLOMAX) 0.4 MG CAPS capsule TAKE 1 CAPSULE BY MOUTH ONCE DAILY 30 capsule 5   No current facility-administered medications for this visit.     Allergies (verified) Review of patient's allergies indicates no known allergies.   PAST HISTORY  Family History Family History  Problem Relation Age of Onset  . Heart disease Mother     due to rheumatic fever  . Colon cancer Neg Hx   . Colon polyps Neg Hx   . Kidney disease Neg Hx   . Diabetes Neg Hx     Social History Social History  Substance Use Topics  . Smoking status: Never Smoker   . Smokeless tobacco: Never Used     Comment: never used tobacco  . Alcohol Use: 0.0 oz/week    0 Standard drinks or equivalent per week     Comment: RARELY    Are there smokers in  your home (other than you)?  No  Risk Factors Current exercise habits: uses an elliptical type machine every day  Dietary issues discussed: reports healthy diet   Cardiac risk factors: advanced age (older than 56 for men, 86 for women) and male gender.  Depression Screen (Note: if answer to either of the following is "Yes", a more complete depression screening is indicated)   Q1: Over the past two weeks, have you felt down, depressed or hopeless? No  Q2: Over the past two weeks, have you felt little interest or pleasure in doing things? No  Have you lost interest or pleasure in daily life? No  Do you  often feel hopeless? No  Do you cry easily over simple problems? No  Activities of Daily Living In your present state of health, do you have any difficulty performing the following activities?:  Driving? No Managing money?  No Feeding yourself? No Getting from bed to chair? No Climbing a flight of stairs? No Preparing food and eating?: No Bathing or showering? No Getting dressed: No Getting to the toilet? No Using the toilet:No Moving around from place to place: No In the past year have you fallen or had a near fall?:No   Are you sexually active?  No  Do you have more than one partner?  No  Hearing Difficulties: No Do you often ask people to speak up or repeat themselves? No Do you experience ringing or noises in your ears? No Do you have difficulty understanding soft or whispered voices? No   Do you feel that you have a problem with memory? No  Do you often misplace items? No  Do you feel safe at home?  No  Cognitive Testing  Alert? Yes  Normal Appearance?Yes  Oriented to person? Yes  Place? Yes   Time? Yes  Recall of three objects?  Yes  Can perform simple calculations? Yes  Displays appropriate judgment?Yes  Can read the correct time from a watch face?Yes   Advanced Directives have been discussed with the patient? Yes   List the Names of Other Physician/Practitioners you currently use: List updated  Indicate any recent Medical Services you may have received from other than [facility] providers in the past year (date may be approximate).  Immunization History  Administered Date(s) Administered  . Influenza Split 03/05/2011  . Influenza Whole 03/12/2008  . Influenza-Unspecified 02/17/2013, 01/31/2014, 01/26/2015  . Pneumococcal Conjugate-13 03/10/2013  . Pneumococcal Polysaccharide-23 03/18/2006  . Td 05/05/2007    Screening Tests Health Maintenance  Topic Date Due  . ZOSTAVAX  04/23/2017 (Originally 12/04/1988)  . INFLUENZA VACCINE  12/03/2015  . TETANUS/TDAP   05/04/2017  . PNA vac Low Risk Adult  Completed    All answers were reviewed with the patient and necessary referrals were made:  O'SULLIVAN,Faylynn Stamos S., NP   04/24/2015   History reviewed: allergies, current medications, past family history, past medical history, past social history, past surgical history and problem list  Review of Systems Objective:      Blood pressure 160/94, pulse 80, temperature 97.7 F (36.5 C), temperature source Oral, resp. rate 16, height 5\' 8"  (1.727 m), weight 184 lb 6.4 oz (83.643 kg), SpO2 100 %. Body mass index is 28.04 kg/(m^2).     Assessment:          Plan:     During the course of the visit the patient was educated and counseled about appropriate screening and preventive services including:    Advanced directives: no advanced directives. discussed  with pt  Diet review for nutrition referral? Yes ____  Not Indicated _x___   Patient Instructions (the written plan) was given to the patient.  Medicare Attestation I have personally reviewed: The patient's medical and social history Their use of alcohol, tobacco or illicit drugs Their current medications and supplements The patient's functional ability including ADLs,fall risks, home safety risks, cognitive, and hearing and visual impairment Diet and physical activities Evidence for depression or mood disorders  The patient's weight, height, BMI, and visual acuity have been recorded in the chart.  I have made referrals, counseling, and provided education to the patient based on review of the above and I have provided the patient with a written personalized care plan for preventive services.     O'SULLIVAN,Evonda Enge S., NP   04/24/2015

## 2015-04-24 NOTE — Progress Notes (Signed)
Subjective:    Patient ID: Brandon Hensley, male    DOB: 1926-09-16, 81 y.o.   MRN: [id number]  HPI   Patient presents today for complete physical.  Immunizations: tetanus up to date, flu shot up to date Diet: healthy Exercise: regular exercise (Elliptical) Colonoscopy: N/A    Review of Systems  Constitutional: Negative for unexpected weight change.  HENT: Negative for rhinorrhea.   Eyes: Negative for visual disturbance.  Respiratory: Negative for cough.   Gastrointestinal: Negative for diarrhea and constipation.  Genitourinary: Negative for dysuria and frequency.  Musculoskeletal: Negative for arthralgias.  Skin: Negative for rash.  Neurological: Negative for headaches.  Hematological: Negative for adenopathy.  Psychiatric/Behavioral:       Denies depression   Past Medical History  Diagnosis Date  . Hypertension   . Colon polyp   . BPH (benign prostatic hypertrophy)   . Polycythemia   . Kidney stones   . Generalized headaches   . Arthritis   . Hemochromatosis 08/22/2014  . Cancer ([place])     colon  . Cancer [facility])     throat-daignosed Nov 2013-radiation tx  . Complete heart block ([place]) 10/02/2014    s/p PPM    Social History   Social History  . Marital Status: Married    Spouse Name: N/A  . Number of Children: 2  . Years of Education: N/A   Occupational History  . Retired    Social History Main Topics  . Smoking status: Never Smoker   . Smokeless tobacco: Never Used     Comment: never used tobacco  . Alcohol Use: 0.0 oz/week    0 Standard drinks or equivalent per week     Comment: RARELY  . Drug Use: No  . Sexual Activity: Not on file   Other Topics Concern  . Not on file   Social History Narrative   CAFFEINE USE:  1 cup coffee daily   Regular exercise:  5 x weekly   Married for 1 year to a woman who is 66.     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           Past Surgical History  Procedure Laterality  Date  . Colon surgery  2005    colon cancer  . Hand surgery  2009    left hand, ?carpal tunnel release  . Hernia repair  2005    ?inguinal   times 3  . Mass biopsy  12/25/2011    Procedure: NECK MASS BIOPSY;  Surgeon: Ruby Cola, MD;  Location: [facility];  Service: ENT;  Laterality: Left;  Left open neck BX  . Colonoscopy w/ polypectomy    . Ep implantable device N/A 10/02/2014    Medtronic Advisa DR MRI compatable pacemaker implanted for AV block    Family History  Problem Relation Age of Onset  . Heart disease Mother     due to rheumatic fever  . Colon cancer Neg Hx   . Colon polyps Neg Hx   . Kidney disease Neg Hx   . Diabetes Neg Hx     No Known Allergies  Current Outpatient Prescriptions on File Prior to Visit  Medication Sig Dispense Refill  . aspirin EC 81 MG tablet Take 81 mg by mouth daily.     Marland Kitchen atenolol (TENORMIN) 25 MG tablet Take 25 mg by mouth 2 (two) times daily.    . tamsulosin (FLOMAX) 0.4 MG CAPS capsule TAKE 1  CAPSULE BY MOUTH ONCE DAILY 30 capsule 5   No current facility-administered medications on file prior to visit.    BP 160/94 mmHg  Pulse 80  Temp(Src) 97.7 F (36.5 C) (Oral)  Resp 16  Ht 5\' 8"  (1.727 m)  Wt 184 lb 6.4 oz (83.643 kg)  BMI 28.04 kg/m2  SpO2 100%       Objective:   Physical Exam Physical Exam  Constitutional: He is oriented to person, place, and time. He appears well-developed and well-nourished. No distress.  HENT:  Head: Normocephalic and atraumatic.  Right Ear: Tympanic membrane and ear canal normal.  Left Ear: Tympanic membrane and ear canal normal.  Mouth/Throat: Oropharynx is clear and moist.  Eyes: Pupils are equal, round, and reactive to light. No scleral icterus.  Neck: Normal range of motion. No thyromegaly present. some post-radiation firmness of tissue- left lateral neck Cardiovascular: Normal rate and regular rhythm.   No murmur heard. Pulmonary/Chest: Effort normal and breath sounds normal. No respiratory  distress. He has no wheezes. He has no rales. He exhibits no tenderness.  Abdominal: Soft. Bowel sounds are normal. He exhibits no distension and no mass. There is no tenderness. There is no rebound and no guarding.  Musculoskeletal: He exhibits no edema.  Lymphadenopathy:    He has no cervical adenopathy.  Neurological: He is alert and oriented to person, place, and time. He has normal patellar reflexes. He exhibits normal muscle tone. Coordination normal.  Skin: Skin is warm and dry.  Psychiatric: He has a normal mood and affect. His behavior is normal. Judgment and thought content normal.          Assessment & Plan:          Assessment & Plan:

## 2015-04-24 NOTE — Assessment & Plan Note (Signed)
Discussed healthy diet, exercise. He declines zostavax.

## 2015-04-24 NOTE — Patient Instructions (Addendum)
Please complete lab work prior to leaving.  Increase atenolol to 50mg  twice daily due to elevated blood pressure.

## 2015-04-25 ENCOUNTER — Ambulatory Visit: Payer: Self-pay

## 2015-04-25 ENCOUNTER — Encounter: Payer: Self-pay | Admitting: Family

## 2015-04-25 ENCOUNTER — Encounter: Payer: Self-pay | Admitting: Cardiology

## 2015-04-25 ENCOUNTER — Ambulatory Visit (HOSPITAL_BASED_OUTPATIENT_CLINIC_OR_DEPARTMENT_OTHER): Payer: Medicare Other | Admitting: Family

## 2015-04-25 ENCOUNTER — Other Ambulatory Visit (HOSPITAL_BASED_OUTPATIENT_CLINIC_OR_DEPARTMENT_OTHER): Payer: Medicare Other

## 2015-04-25 LAB — CBC WITH DIFFERENTIAL (CANCER CENTER ONLY)
BASO#: 0 10*3/uL (ref 0.0–0.2)
BASO%: 0.3 % (ref 0.0–2.0)
EOS%: 4.3 % (ref 0.0–7.0)
Eosinophils Absolute: 0.3 10*3/uL (ref 0.0–0.5)
HCT: 48.1 % (ref 38.7–49.9)
HGB: 16 g/dL (ref 13.0–17.1)
LYMPH#: 1.5 10*3/uL (ref 0.9–3.3)
LYMPH%: 18.9 % (ref 14.0–48.0)
MCH: 31.9 pg (ref 28.0–33.4)
MCHC: 33.3 g/dL (ref 32.0–35.9)
MCV: 96 fL (ref 82–98)
MONO#: 1 10*3/uL — ABNORMAL HIGH (ref 0.1–0.9)
MONO%: 13 % (ref 0.0–13.0)
NEUT#: 5 10*3/uL (ref 1.5–6.5)
NEUT%: 63.5 % (ref 40.0–80.0)
Platelets: 142 10*3/uL — ABNORMAL LOW (ref 145–400)
RBC: 5.02 10*6/uL (ref 4.20–5.70)
RDW: 13.1 % (ref 11.1–15.7)
WBC: 7.9 10*3/uL (ref 4.0–10.0)

## 2015-04-25 LAB — IRON AND TIBC
%SAT: 40 % (ref 20–55)
Iron: 119 ug/dL (ref 42–163)
TIBC: 296 ug/dL (ref 202–409)
UIBC: 177 ug/dL (ref 117–376)

## 2015-04-25 LAB — FERRITIN: Ferritin: 36 ng/ml (ref 22–316)

## 2015-04-25 NOTE — Progress Notes (Signed)
Hematology and Oncology Follow Up Visit  Brandon Hensley [id number] 1926-05-14 81 y.o. 04/25/2015   Principle Diagnosis:  Hemochromatosis (heterozygous for H63D mutation)  Current Therapy:   Phlebotomy to maintain ferritin below 100 - last one in April 2016    Interim History: Brandon Hensley is here today for a follow-up. He is doing well and has no complaints at this time. His last phlebotomy was in April and he had a nice response. His ferritin in September was 37.  He has no c/o rash, cough, dizziness, headaches, vision changes, SOB, chest pain, palpitations, abdominal pain or changes in bowel or bladder habits.  He has difficulty swallowing and hoarseness occasionally from the radiation he had to the neck in 2013.  He still eats well and stays hydrated with water. His weight is unchanged.  He has had no new cardiac issues since May and having the pacemaker placed.   No lymphadenopathy found on exam.  No swelling, tenderness, numbness or tingling in his extremities. He continues to stay active and has several hobbies to keep him busy.   Medications:    Medication List       This list is accurate as of: 04/25/15  8:48 AM.  Always use your most recent med list.               aspirin EC 81 MG tablet  Take 81 mg by mouth daily.     atenolol 50 MG tablet  Commonly known as:  TENORMIN  Take 1 tablet (50 mg total) by mouth 2 (two) times daily.     tamsulosin 0.4 MG Caps capsule  Commonly known as:  FLOMAX  TAKE 1 CAPSULE BY MOUTH ONCE DAILY        Allergies: No Known Allergies  Past Medical History, Surgical history, Social history, and Family History were reviewed and updated.  Review of Systems: All other 10 point review of systems is negative.   Physical Exam:  height is 5\' 8"  (1.727 m) and weight is 184 lb (83.462 kg). His oral temperature is 97.8 F (36.6 C). His blood pressure is 176/96 and his pulse is 85. His respiration is 16.   Wt Readings from Last 3  Encounters:  04/25/15 184 lb (83.462 kg)  04/24/15 184 lb 6.4 oz (83.643 kg)  01/24/15 190 lb (86.183 kg)    Ocular: Sclerae unicteric, pupils equal, round and reactive to light Ear-nose-throat: Oropharynx clear, dentition fair Lymphatic: No cervical supraclavicular or axillary adenopathy Lungs no rales or rhonchi, good excursion bilaterally Heart regular rate and rhythm, no murmur appreciated Abd soft, nontender, positive bowel sounds, no liver or spleen tip palpated on exam MSK no focal spinal tenderness, no joint edema Neuro: non-focal, well-oriented, appropriate affect Breasts: Deferred  Lab Results  Component Value Date   WBC 7.9 04/25/2015   HGB 16.0 04/25/2015   HCT 48.1 04/25/2015   MCV 96 04/25/2015   PLT 142* 04/25/2015   Lab Results  Component Value Date   FERRITIN 37 01/24/2015   IRON 98 01/24/2015   TIBC 283 01/24/2015   UIBC 185 01/24/2015   IRONPCTSAT 35 01/24/2015   Lab Results  Component Value Date   RETICCTPCT 1.57 03/11/2011   RBC 5.02 04/25/2015   RETICCTABS 81.33 03/11/2011   No results found for: KPAFRELGTCHN, LAMBDASER, KAPLAMBRATIO No results found for: IGGSERUM, IGA, IGMSERUM No results found for: TOTALPROTELP, ALBUMINELP, A1GS, A2GS, BETS, BETA2SER, GAMS, MSPIKE, SPEI   Chemistry      Component Value Date/Time  NA 139 04/24/2015 1006   NA 140 10/03/2012 0925   NA 142 07/27/2008 0841   K 4.9 04/24/2015 1006   K 4.2 10/03/2012 0925   K 4.6 07/27/2008 0841   CL 102 04/24/2015 1006   CL 102 10/03/2012 0925   CL 99 07/27/2008 0841   CO2 31 04/24/2015 1006   CO2 30* 10/03/2012 0925   CO2 29 07/27/2008 0841   BUN 23 04/24/2015 1006   BUN 11.5 10/03/2012 0925   BUN 17 07/27/2008 0841   CREATININE 1.23 04/24/2015 1006   CREATININE 1.07 10/16/2013 0913   CREATININE 1.1 10/03/2012 0925      Component Value Date/Time   CALCIUM 9.8 04/24/2015 1006   CALCIUM 9.5 10/03/2012 0925   CALCIUM 9.2 07/27/2008 0841   ALKPHOS 88 04/24/2015 1006     ALKPHOS 87 10/03/2012 0925   ALKPHOS 91* 07/27/2008 0841   AST 23 04/24/2015 1006   AST 20 10/03/2012 0925   AST 25 07/27/2008 0841   ALT 17 04/24/2015 1006   ALT 16 10/03/2012 0925   ALT 24 07/27/2008 0841   BILITOT 0.8 04/24/2015 1006   BILITOT 0.77 10/03/2012 0925   BILITOT 1.00 07/27/2008 0841     Impression and Plan: Brandon Hensley is 81 year old gentleman with hemochromatosis. His last phlebotomy was in April he had a nice response. His Hct in September was 37. We will see what his iron studies today show.  [name] at this time. No new cardiac events. He has done well with the pacemaker.  He will not need phlebotomized today.  We will plan to see him back in 3 months for labs and follow-up.  He knows to call here with any questions or concerns. We can certainly see him sooner if need be.   Brandon Bottom, NP 12/22/20168:48 AM

## 2015-04-25 NOTE — Progress Notes (Signed)
No phlebotomy per Judson Roch, NP

## 2015-05-09 ENCOUNTER — Encounter: Payer: Self-pay | Admitting: Cardiology

## 2015-05-22 ENCOUNTER — Ambulatory Visit (INDEPENDENT_AMBULATORY_CARE_PROVIDER_SITE_OTHER): Payer: Medicare Other | Admitting: Family

## 2015-05-22 ENCOUNTER — Encounter: Payer: Self-pay | Admitting: Family

## 2015-05-22 VITALS — BP 140/90 | HR 83 | Temp 99.0°F | Resp 16 | Ht 68.0 in | Wt 186.8 lb

## 2015-05-22 DIAGNOSIS — I10 Essential (primary) hypertension: Secondary | ICD-10-CM

## 2015-05-22 DIAGNOSIS — J069 Acute upper respiratory infection, unspecified: Secondary | ICD-10-CM | POA: Diagnosis not present

## 2015-05-22 NOTE — Progress Notes (Signed)
Subjective:    Patient ID: Brandon Hensley, male    DOB: July 13, 1926, 82 y.o.   MRN: [id number]  HPI  Brandon Hensley is an 82 yr old male who presents today for follow up.  1) HTN- denies CP/SOB or swelling.  Tolerating increase dose of atenolol.   BP Readings from Last 3 Encounters:  05/22/15 140/90  04/25/15 176/96  04/24/15 160/94   2) Nasal congestion-reports + nasal congestion which he attributes to uri.   Review of Systems See HPI  Past Medical History  Diagnosis Date  . Hypertension   . Colon polyp   . BPH (benign prostatic hypertrophy)   . Polycythemia   . Kidney stones   . Generalized headaches   . Arthritis   . Hemochromatosis 08/22/2014  . Cancer ([place])     colon  . Cancer [facility])     throat-daignosed Nov 2013-radiation tx  . Complete heart block ([place]) 10/02/2014    s/p PPM    Social History   Social History  . Marital Status: Married    Spouse Name: N/A  . Number of Children: 2  . Years of Education: N/A   Occupational History  . Retired    Social History Main Topics  . Smoking status: Never Smoker   . Smokeless tobacco: Never Used     Comment: never used tobacco  . Alcohol Use: 0.0 oz/week    0 Standard drinks or equivalent per week     Comment: RARELY  . Drug Use: No  . Sexual Activity: Not on file   Other Topics Concern  . Not on file   Social History Narrative   CAFFEINE USE:  1 cup coffee daily   Regular exercise:  5 x weekly   Married for 1 year to a woman who is 3.     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           Past Surgical History  Procedure Laterality Date  . Colon surgery  2005    colon cancer  . Hand surgery  2009    left hand, ?carpal tunnel release  . Hernia repair  2005    ?inguinal   times 3  . Mass biopsy  12/25/2011    Procedure: NECK MASS BIOPSY;  Surgeon: Ruby Cola, MD;  Location: [facility];  Service: ENT;  Laterality: Left;  Left open neck BX  . Colonoscopy w/  polypectomy    . Ep implantable device N/A 10/02/2014    Medtronic Advisa DR MRI compatable pacemaker implanted for AV block    Family History  Problem Relation Age of Onset  . Heart disease Mother     due to rheumatic fever  . Colon cancer Neg Hx   . Colon polyps Neg Hx   . Kidney disease Neg Hx   . Diabetes Neg Hx     No Known Allergies  Current Outpatient Prescriptions on File Prior to Visit  Medication Sig Dispense Refill  . aspirin EC 81 MG tablet Take 81 mg by mouth daily.     Marland Kitchen atenolol (TENORMIN) 50 MG tablet Take 1 tablet (50 mg total) by mouth 2 (two) times daily. 60 tablet 3  . tamsulosin (FLOMAX) 0.4 MG CAPS capsule TAKE 1 CAPSULE BY MOUTH ONCE DAILY 30 capsule 5   No current facility-administered medications on file prior to visit.    BP 140/90 mmHg  Pulse 83  Temp(Src) 99 F (37.2 C) (  Oral)  Resp 16  Ht 5\' 8"  (1.727 m)  Wt 186 lb 12.8 oz (84.732 kg)  BMI 28.41 kg/m2  SpO2 100%       Objective:   Physical Exam  Constitutional: He is oriented to person, place, and time. He appears well-developed and well-nourished. No distress.  HENT:  Head: Normocephalic and atraumatic.  Right Ear: Tympanic membrane and ear canal normal.  Left Ear: Tympanic membrane and ear canal normal.  Mouth/Throat: No oropharyngeal exudate or posterior oropharyngeal edema.  Cardiovascular: Normal rate and regular rhythm.   No murmur heard. Pulmonary/Chest: Effort normal and breath sounds normal. No respiratory distress. He has no wheezes. He has no rales.  Musculoskeletal: He exhibits no edema.  Neurological: He is alert and oriented to person, place, and time.  Skin: Skin is warm and dry.  Psychiatric: He has a normal mood and affect. His behavior is normal. Thought content normal.          Assessment & Plan:  URI- advised pt to let us know if symptoms do not improve.

## 2015-05-22 NOTE — Patient Instructions (Signed)
Please continue current dose of atenolol.  Call if your cold symptoms worsen or do not improve.

## 2015-05-22 NOTE — Assessment & Plan Note (Signed)
Improved on current dose of atenolol. Continue same.

## 2015-05-22 NOTE — Progress Notes (Signed)
Pre visit review using our clinic review tool, if applicable. No additional management support is needed unless otherwise documented below in the visit note. 

## 2015-06-26 ENCOUNTER — Other Ambulatory Visit: Payer: Self-pay | Admitting: Family

## 2015-07-08 ENCOUNTER — Telehealth: Payer: Self-pay | Admitting: Family

## 2015-07-08 MED ORDER — TAMSULOSIN HCL 0.4 MG PO CAPS: 0.4000 mg | ORAL_CAPSULE | Freq: Every day | ORAL | Status: DC

## 2015-07-08 NOTE — Telephone Encounter (Signed)
Caller name: Self  Can be reached: [phone] Pharmacy:  [id number] - [place], [place] - [address] AT [place] [phone] (Phone) [phone] (Fax)         Reason for call: Refill on tamsulosin (FLOMAX) 0.4 MG CAPS capsule [id number] Patient states he had it refilled last week but has lost it

## 2015-07-08 NOTE — Telephone Encounter (Signed)
Medication filled to pharmacy as requested.   

## 2015-07-17 ENCOUNTER — Ambulatory Visit (HOSPITAL_BASED_OUTPATIENT_CLINIC_OR_DEPARTMENT_OTHER): Payer: Medicare Other | Admitting: Hematology & Oncology

## 2015-07-17 ENCOUNTER — Encounter: Payer: Self-pay | Admitting: Hematology & Oncology

## 2015-07-17 ENCOUNTER — Other Ambulatory Visit (HOSPITAL_BASED_OUTPATIENT_CLINIC_OR_DEPARTMENT_OTHER): Payer: Medicare Other

## 2015-07-17 ENCOUNTER — Ambulatory Visit: Payer: Self-pay

## 2015-07-17 LAB — IRON AND TIBC
%SAT: 26 % (ref 20–55)
Iron: 70 ug/dL (ref 42–163)
TIBC: 275 ug/dL (ref 202–409)
UIBC: 205 ug/dL (ref 117–376)

## 2015-07-17 LAB — COMPREHENSIVE METABOLIC PANEL
ALBUMIN: 3.8 g/dL (ref 3.5–5.0)
ALK PHOS: 92 U/L (ref 40–150)
ALT: 15 U/L (ref 0–55)
AST: 21 U/L (ref 5–34)
Anion Gap: 6 mEq/L (ref 3–11)
BILIRUBIN TOTAL: 0.55 mg/dL (ref 0.20–1.20)
BUN: 19.2 mg/dL (ref 7.0–26.0)
CALCIUM: 9.4 mg/dL (ref 8.4–10.4)
CO2: 31 meq/L — AB (ref 22–29)
Chloride: 104 mEq/L (ref 98–109)
Creatinine: 1.3 mg/dL (ref 0.7–1.3)
EGFR: 48 mL/min/{1.73_m2} — AB (ref >90)
GLUCOSE: 98 mg/dL (ref 70–140)
POTASSIUM: 4.5 meq/L (ref 3.5–5.1)
Sodium: 140 mEq/L (ref 136–145)
TOTAL PROTEIN: 6.8 g/dL (ref 6.4–8.3)

## 2015-07-17 LAB — CBC WITH DIFFERENTIAL (CANCER CENTER ONLY)
BASO#: 0 10*3/uL (ref 0.0–0.2)
BASO%: 0.4 % (ref 0.0–2.0)
EOS ABS: 0.4 10*3/uL (ref 0.0–0.5)
EOS%: 5.2 % (ref 0.0–7.0)
HEMATOCRIT: 46.1 % (ref 38.7–49.9)
HEMOGLOBIN: 16.1 g/dL (ref 13.0–17.1)
LYMPH#: 1.4 10*3/uL (ref 0.9–3.3)
LYMPH%: 17.1 % (ref 14.0–48.0)
MCH: 32.7 pg (ref 28.0–33.4)
MCHC: 34.9 g/dL (ref 32.0–35.9)
MCV: 94 fL (ref 82–98)
MONO#: 0.9 10*3/uL (ref 0.1–0.9)
MONO%: 11.7 % (ref 0.0–13.0)
NEUT%: 65.6 % (ref 40.0–80.0)
NEUTROS ABS: 5.2 10*3/uL (ref 1.5–6.5)
Platelets: 145 10*3/uL (ref 145–400)
RBC: 4.93 10*6/uL (ref 4.20–5.70)
RDW: 12.9 % (ref 11.1–15.7)
WBC: 8 10*3/uL (ref 4.0–10.0)

## 2015-07-17 LAB — FERRITIN: Ferritin: 41 ng/ml (ref 22–316)

## 2015-07-17 NOTE — Progress Notes (Signed)
Hematology and Oncology Follow Up Visit  ERION GAUNCE [id number] 12/09/1926 82 y.o. 07/17/2015   Principle Diagnosis:   Hemochromatosis (heterozygous for H63D mutation)  Current Therapy:    Phlebotomy to maintain ferritin below 100     Interim History:  Mr. Deutschman is back for follow-up. We saw him back in September. Overall, hemochromatosis really has not been a problem for him. He pies not been phlebotomized for a year.  He is doing well otherwise. He's had no problems from my point of view. He has had some left neck issues from where he had past neck surgery. He does have a pacemaker in. He has had no issues with this.  He is still very active. He still does karate.  Overall, his performance status is ECOG 0.  Medications:  Current outpatient prescriptions:  .  aspirin EC 81 MG tablet, Take 81 mg by mouth daily. , Disp: , Rfl:  .  atenolol (TENORMIN) 50 MG tablet, Take 1 tablet (50 mg total) by mouth 2 (two) times daily., Disp: 60 tablet, Rfl: 3 .  tamsulosin (FLOMAX) 0.4 MG CAPS capsule, Take 1 capsule (0.4 mg total) by mouth daily., Disp: 30 capsule, Rfl: 2  Allergies: No Known Allergies  Past Medical History, Surgical history, Social history, and Family History were reviewed and updated.  Review of Systems: As above  Physical Exam:  height is 5\' 8"  (1.727 m) and weight is 185 lb (83.915 kg). His oral temperature is 97.8 F (36.6 C). His blood pressure is 152/94 and his pulse is 86. His respiration is 16.   Wt Readings from Last 3 Encounters:  07/17/15 185 lb (83.915 kg)  05/22/15 186 lb 12.8 oz (84.732 kg)  04/25/15 184 lb (83.462 kg)     Well-developed and well-nourished white gentleman. Head and neck exam shows no ocular or oral lesions. There are no palpable cervical or supraclavicular lymph nodes. Lungs are clear. Cardiac exam regular rate and rhythm with no murmurs, rubs or bruits. Abdomen is soft. He has good bowel sounds. There is no fluid wave. There  is no palpable liver or spleen tip. Back exam shows no tenderness over the spine, ribs or hips. Externally shows no clubbing, cyanosis or edema. Skin exam shows no rashes, ecchymoses or petechia. Neurological exam shows no focal neurological deficits.  Lab Results  Component Value Date   WBC 8.0 07/17/2015   HGB 16.1 07/17/2015   HCT 46.1 07/17/2015   MCV 94 07/17/2015   PLT 145 07/17/2015     Chemistry      Component Value Date/Time   NA 139 04/24/2015 1006   NA 140 10/03/2012 0925   NA 142 07/27/2008 0841   K 4.9 04/24/2015 1006   K 4.2 10/03/2012 0925   K 4.6 07/27/2008 0841   CL 102 04/24/2015 1006   CL 102 10/03/2012 0925   CL 99 07/27/2008 0841   CO2 31 04/24/2015 1006   CO2 30* 10/03/2012 0925   CO2 29 07/27/2008 0841   BUN 23 04/24/2015 1006   BUN 11.5 10/03/2012 0925   BUN 17 07/27/2008 0841   CREATININE 1.23 04/24/2015 1006   CREATININE 1.07 10/16/2013 0913   CREATININE 1.1 10/03/2012 0925      Component Value Date/Time   CALCIUM 9.8 04/24/2015 1006   CALCIUM 9.5 10/03/2012 0925   CALCIUM 9.2 07/27/2008 0841   ALKPHOS 88 04/24/2015 1006   ALKPHOS 87 10/03/2012 0925   ALKPHOS 91* 07/27/2008 0841   AST 23 04/24/2015 1006  AST 20 10/03/2012 0925   AST 25 07/27/2008 0841   ALT 17 04/24/2015 1006   ALT 16 10/03/2012 0925   ALT 24 07/27/2008 0841   BILITOT 0.8 04/24/2015 1006   BILITOT 0.77 10/03/2012 0925   BILITOT 1.00 07/27/2008 0841         Impression and Plan: Mr. Schwendiman is 82 year old gentleman. He actually has hemochromatosis.  We will have to see what his ferritin is. I will like to think that his ferritin should be okay.  We'll plan to get him back in 6 months.   Volanda Napoleon, MD 3/15/20179:37 AM

## 2015-07-17 NOTE — Progress Notes (Signed)
No phlebotomy needed today per Dr Marin Olp. dph

## 2015-07-22 ENCOUNTER — Encounter: Payer: Medicare Other | Admitting: *Deleted

## 2015-07-26 ENCOUNTER — Encounter: Payer: Self-pay | Admitting: Cardiology

## 2015-07-29 ENCOUNTER — Ambulatory Visit (INDEPENDENT_AMBULATORY_CARE_PROVIDER_SITE_OTHER): Payer: Medicare Other | Admitting: *Deleted

## 2015-07-29 DIAGNOSIS — Z950 Presence of cardiac pacemaker: Secondary | ICD-10-CM | POA: Diagnosis not present

## 2015-07-29 DIAGNOSIS — I442 Atrioventricular block, complete: Secondary | ICD-10-CM | POA: Diagnosis not present

## 2015-07-29 NOTE — Progress Notes (Signed)
Remote pacemaker transmission.   

## 2015-08-08 LAB — CUP PACEART REMOTE DEVICE CHECK
Battery Remaining Longevity: 106 mo
Brady Statistic AP VP Percent: 87.1 %
Brady Statistic AP VS Percent: 0 %
Brady Statistic AS VS Percent: 0 %
Implantable Lead Implant Date: 20160531
Implantable Lead Location: 753859
Implantable Lead Model: 5076
Lead Channel Impedance Value: 418 Ohm
Lead Channel Pacing Threshold Amplitude: 0.75 V
Lead Channel Sensing Intrinsic Amplitude: 2.5 mV
Lead Channel Sensing Intrinsic Amplitude: 2.5 mV
Lead Channel Sensing Intrinsic Amplitude: 6.875 mV
Lead Channel Setting Pacing Amplitude: 2.5 V
MDC IDC LEAD IMPLANT DT: 20160531
MDC IDC LEAD LOCATION: 753860
MDC IDC MSMT BATTERY VOLTAGE: 3.02 V
MDC IDC MSMT LEADCHNL RA IMPEDANCE VALUE: 342 Ohm
MDC IDC MSMT LEADCHNL RA PACING THRESHOLD AMPLITUDE: 0.5 V
MDC IDC MSMT LEADCHNL RA PACING THRESHOLD PULSEWIDTH: 0.4 ms
MDC IDC MSMT LEADCHNL RV IMPEDANCE VALUE: 437 Ohm
MDC IDC MSMT LEADCHNL RV IMPEDANCE VALUE: 608 Ohm
MDC IDC MSMT LEADCHNL RV PACING THRESHOLD PULSEWIDTH: 0.4 ms
MDC IDC MSMT LEADCHNL RV SENSING INTR AMPL: 6.875 mV
MDC IDC SESS DTM: 20170327125622
MDC IDC SET LEADCHNL RA PACING AMPLITUDE: 2 V
MDC IDC SET LEADCHNL RV PACING PULSEWIDTH: 0.4 ms
MDC IDC SET LEADCHNL RV SENSING SENSITIVITY: 4 mV
MDC IDC STAT BRADY AS VP PERCENT: 12.9 %
MDC IDC STAT BRADY RA PERCENT PACED: 87.1 %
MDC IDC STAT BRADY RV PERCENT PACED: 100 %

## 2015-08-13 ENCOUNTER — Encounter: Payer: Self-pay | Admitting: Cardiology

## 2015-08-20 ENCOUNTER — Ambulatory Visit (INDEPENDENT_AMBULATORY_CARE_PROVIDER_SITE_OTHER): Payer: Medicare Other | Admitting: Family

## 2015-08-20 ENCOUNTER — Encounter: Payer: Self-pay | Admitting: Family

## 2015-08-20 ENCOUNTER — Telehealth: Payer: Self-pay | Admitting: Family

## 2015-08-20 VITALS — BP 160/100 | HR 85 | Temp 98.2°F | Resp 18 | Ht 68.0 in | Wt 185.4 lb

## 2015-08-20 DIAGNOSIS — E039 Hypothyroidism, unspecified: Secondary | ICD-10-CM

## 2015-08-20 DIAGNOSIS — I10 Essential (primary) hypertension: Secondary | ICD-10-CM

## 2015-08-20 DIAGNOSIS — F418 Other specified anxiety disorders: Secondary | ICD-10-CM | POA: Insufficient documentation

## 2015-08-20 DIAGNOSIS — R5383 Other fatigue: Secondary | ICD-10-CM

## 2015-08-20 LAB — CBC WITH DIFFERENTIAL/PLATELET
BASOS ABS: 0 10*3/uL (ref 0.0–0.1)
Basophils Relative: 0.3 % (ref 0.0–3.0)
EOS ABS: 0.3 10*3/uL (ref 0.0–0.7)
Eosinophils Relative: 4 % (ref 0.0–5.0)
HCT: 46.9 % (ref 39.0–52.0)
Hemoglobin: 15.8 g/dL (ref 13.0–17.0)
LYMPHS ABS: 1.4 10*3/uL (ref 0.7–4.0)
Lymphocytes Relative: 16.3 % (ref 12.0–46.0)
MCHC: 33.6 g/dL (ref 30.0–36.0)
MCV: 94.4 fl (ref 78.0–100.0)
MONO ABS: 0.8 10*3/uL (ref 0.1–1.0)
Monocytes Relative: 9.7 % (ref 3.0–12.0)
NEUTROS ABS: 6.1 10*3/uL (ref 1.4–7.7)
NEUTROS PCT: 69.7 % (ref 43.0–77.0)
PLATELETS: 151 10*3/uL (ref 150.0–400.0)
RBC: 4.97 Mil/uL (ref 4.22–5.81)
RDW: 13.3 % (ref 11.5–15.5)
WBC: 8.7 10*3/uL (ref 4.0–10.5)

## 2015-08-20 LAB — COMPREHENSIVE METABOLIC PANEL
ALT: 14 U/L (ref 0–53)
AST: 20 U/L (ref 0–37)
Albumin: 4.1 g/dL (ref 3.5–5.2)
Alkaline Phosphatase: 79 U/L (ref 39–117)
BILIRUBIN TOTAL: 0.7 mg/dL (ref 0.2–1.2)
BUN: 24 mg/dL — ABNORMAL HIGH (ref 6–23)
CALCIUM: 9.5 mg/dL (ref 8.4–10.5)
CHLORIDE: 103 meq/L (ref 96–112)
CO2: 34 meq/L — AB (ref 19–32)
Creatinine, Ser: 1.29 mg/dL (ref 0.40–1.50)
GFR: 56.04 mL/min — AB (ref >60.00)
GLUCOSE: 121 mg/dL — AB (ref 70–99)
POTASSIUM: 4.6 meq/L (ref 3.5–5.1)
SODIUM: 140 meq/L (ref 135–145)
Total Protein: 6.8 g/dL (ref 6.0–8.3)

## 2015-08-20 LAB — TSH: TSH: 5.96 u[IU]/mL — ABNORMAL HIGH (ref 0.35–4.50)

## 2015-08-20 MED ORDER — LEVOTHYROXINE SODIUM 25 MCG PO TABS: 25.0000 ug | ORAL_TABLET | Freq: Every day | ORAL | Status: DC

## 2015-08-20 MED ORDER — ESCITALOPRAM OXALATE 10 MG PO TABS: ORAL_TABLET | ORAL | Status: DC

## 2015-08-20 MED ORDER — LISINOPRIL 10 MG PO TABS: 10.0000 mg | ORAL_TABLET | Freq: Every day | ORAL | Status: DC

## 2015-08-20 NOTE — Assessment & Plan Note (Signed)
Deteriorated.  Will initiate lexapro 10mg .  I instructed pt to start 1/2 tablet once daily for 1 week and then increase to a full tablet once daily on week two as tolerated.   Also discussed rare but serious side effect of suicide ideation.  She is instructed to discontinue medication go directly to ED if this occurs.  Pt verbalizes understanding.  Plan follow up in 1 month to evaluate progress.

## 2015-08-20 NOTE — Telephone Encounter (Signed)
Please ask the lab to add on free t3 and free t4.   Let pt know lab shows underactive thyroid. This could be contributing to his fatigue.  I would like him to add synthroid 4mcg once daily.

## 2015-08-20 NOTE — Patient Instructions (Signed)
Continue current dose of atenolol. Add lisinopril 10mg  once daily for blood pressure. Start lexapro 10mg , 1/2 tab once daily for 1 week, then increase to a full tab once daily on week two.

## 2015-08-20 NOTE — Progress Notes (Signed)
Subjective:    Patient ID: Brandon Hensley, male    DOB: June 07, 1926, 82 y.o.   MRN: [id number]  HPI  Brandon Hensley is an 82 yr old male who presents today for follow up of his blood pressure. He denies CP of SOB.  Reports + fatigue.  BP Readings from Last 3 Encounters:  08/20/15 160/100  07/17/15 152/94  05/22/15 140/90   Notes recent increase in stress.  Having increase anxiety.  Reports that if he has trouble falling back to sleep. Feels down. Denies current SI/HI.  Feels overwhelmed with financial issues.  Review of Systems See HPI  Past Medical History  Diagnosis Date  . Hypertension   . Colon polyp   . BPH (benign prostatic hypertrophy)   . Polycythemia   . Kidney stones   . Generalized headaches   . Arthritis   . Hemochromatosis 08/22/2014  . Cancer ([place])     colon  . Cancer [facility])     throat-daignosed Nov 2013-radiation tx  . Complete heart block ([place]) 10/02/2014    s/p PPM     Social History   Social History  . Marital Status: Married    Spouse Name: N/A  . Number of Children: 2  . Years of Education: N/A   Occupational History  . Retired    Social History Main Topics  . Smoking status: Never Smoker   . Smokeless tobacco: Never Used     Comment: never used tobacco  . Alcohol Use: 0.0 oz/week    0 Standard drinks or equivalent per week     Comment: RARELY  . Drug Use: No  . Sexual Activity: Not on file   Other Topics Concern  . Not on file   Social History Narrative   CAFFEINE USE:  1 cup coffee daily   Regular exercise:  5 x weekly   Married for 1 year to a woman who is 39.     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           Past Surgical History  Procedure Laterality Date  . Colon surgery  2005    colon cancer  . Hand surgery  2009    left hand, ?carpal tunnel release  . Hernia repair  2005    ?inguinal   times 3  . Mass biopsy  12/25/2011    Procedure: NECK MASS BIOPSY;  Surgeon: Ruby Cola, MD;   Location: [place];  Service: ENT;  Laterality: Left;  Left open neck BX  . Colonoscopy w/ polypectomy    . Ep implantable device N/A 10/02/2014    Medtronic Advisa DR MRI compatable pacemaker implanted for AV block    Family History  Problem Relation Age of Onset  . Heart disease Mother     due to rheumatic fever  . Colon cancer Neg Hx   . Colon polyps Neg Hx   . Kidney disease Neg Hx   . Diabetes Neg Hx     No Known Allergies  Current Outpatient Prescriptions on File Prior to Visit  Medication Sig Dispense Refill  . aspirin EC 81 MG tablet Take 81 mg by mouth daily.     Marland Kitchen atenolol (TENORMIN) 50 MG tablet Take 1 tablet (50 mg total) by mouth 2 (two) times daily. 60 tablet 3  . tamsulosin (FLOMAX) 0.4 MG CAPS capsule Take 1 capsule (0.4 mg total) by mouth daily. 30 capsule 2   No current facility-administered  medications on file prior to visit.    BP 160/100 mmHg  Pulse 85  Temp(Src) 98.2 F (36.8 C) (Oral)  Resp 18  Ht 5\' 8"  (1.727 m)  Wt 185 lb 6.4 oz (84.097 kg)  BMI 28.20 kg/m2  SpO2 100%       Objective:   Physical Exam  Constitutional: He is oriented to person, place, and time. He appears well-developed and well-nourished. No distress.  HENT:  Head: Normocephalic and atraumatic.  Cardiovascular: Normal rate and regular rhythm.   No murmur heard. Pulmonary/Chest: Effort normal and breath sounds normal. No respiratory distress. He has no wheezes. He has no rales.  Musculoskeletal: He exhibits no edema.  Neurological: He is alert and oriented to person, place, and time.  Skin: Skin is warm and dry.  Psychiatric: His behavior is normal. Thought content normal.  Flat affect, tearful.           Assessment & Plan:

## 2015-08-20 NOTE — Assessment & Plan Note (Signed)
Uncontrolled.  Continue atenolol. Add lisinopril.

## 2015-08-20 NOTE — Progress Notes (Signed)
Pre visit review using our clinic review tool, if applicable. No additional management support is needed unless otherwise documented below in the visit note. 

## 2015-08-21 ENCOUNTER — Other Ambulatory Visit (INDEPENDENT_AMBULATORY_CARE_PROVIDER_SITE_OTHER): Payer: Medicare Other

## 2015-08-21 DIAGNOSIS — I519 Heart disease, unspecified: Principal | ICD-10-CM

## 2015-08-21 DIAGNOSIS — E039 Hypothyroidism, unspecified: Secondary | ICD-10-CM | POA: Diagnosis not present

## 2015-08-21 LAB — T4, FREE: FREE T4: 0.79 ng/dL (ref 0.60–1.60)

## 2015-08-21 LAB — T3, FREE: T3, Free: 2.7 pg/mL (ref 2.3–4.2)

## 2015-08-21 NOTE — Telephone Encounter (Signed)
Notified pt and he voices understanding. Add on form faxed to the lab. Awaiting results. Pt states he woke up in the middle of the night with throbbing in his throat, throat now sore and has some cough. Pt will call for appt if symptoms worsen or do not get better in the next couple of days.

## 2015-08-21 NOTE — Telephone Encounter (Signed)
Noted and agree. 

## 2015-08-26 ENCOUNTER — Other Ambulatory Visit: Payer: Self-pay | Admitting: Family

## 2015-08-26 NOTE — Telephone Encounter (Signed)
Atenolol 50mg  twice a day was taken off pt's med list at 08/20/15 office visit but office note said to continue current dose of atenolol and add lisinopril. Please advise as pt is requesting refill?

## 2015-08-27 ENCOUNTER — Encounter: Payer: Self-pay | Admitting: Cardiology

## 2015-09-24 ENCOUNTER — Other Ambulatory Visit: Payer: Self-pay | Admitting: Family

## 2015-09-25 MED ORDER — ESCITALOPRAM OXALATE 10 MG PO TABS: 10.0000 mg | ORAL_TABLET | Freq: Every day | ORAL | Status: DC

## 2015-09-25 NOTE — Telephone Encounter (Signed)
Refill sent, but he is due for follow up.  Please call pt to schedule.

## 2015-09-25 NOTE — Telephone Encounter (Signed)
Scheduled for 10/02/15

## 2015-10-02 ENCOUNTER — Ambulatory Visit (INDEPENDENT_AMBULATORY_CARE_PROVIDER_SITE_OTHER): Payer: Medicare Other | Admitting: Family

## 2015-10-02 ENCOUNTER — Encounter: Payer: Self-pay | Admitting: Family

## 2015-10-02 VITALS — BP 164/92 | HR 77 | Temp 98.2°F | Resp 20 | Ht 68.0 in | Wt 184.6 lb

## 2015-10-02 DIAGNOSIS — E039 Hypothyroidism, unspecified: Secondary | ICD-10-CM | POA: Diagnosis not present

## 2015-10-02 DIAGNOSIS — F418 Other specified anxiety disorders: Secondary | ICD-10-CM | POA: Diagnosis not present

## 2015-10-02 DIAGNOSIS — I10 Essential (primary) hypertension: Secondary | ICD-10-CM | POA: Diagnosis not present

## 2015-10-02 NOTE — Assessment & Plan Note (Signed)
Not clear if diarrhea was side effect of medication or not. Advised pt to resume synthroid initially at 1/2 tab once daily, then increase to full tab as tolerated.

## 2015-10-02 NOTE — Assessment & Plan Note (Signed)
Improved on lexapro, continue same.  

## 2015-10-02 NOTE — Progress Notes (Signed)
Subjective:    Patient ID: Brandon Hensley, male    DOB: 01/14/1927, 82 y.o.   MRN: [id number]  HPI  Brandon Hensley is an 82 yr old male who presents today for follow up:  1) HTN- reports that his blood pressure went "too low" on lisinopril. Stopped lisinopril and is only taking AM  Dose of atenolol.  BP Readings from Last 3 Encounters:  10/02/15 164/92  08/20/15 160/100  07/17/15 152/94   2) Depression/anxiety- maintained on lexapro. He reports that his mood seems better.    3) Hypothyroid- reports that he stopped levothyroxine due to diarrhea. Had recent diarrheal illness as well which he reports is improving.    Review of Systems See HPI  Past Medical History  Diagnosis Date  . Hypertension   . Colon polyp   . BPH (benign prostatic hypertrophy)   . Polycythemia   . Kidney stones   . Generalized headaches   . Arthritis   . Hemochromatosis 08/22/2014  . Cancer ([place])     colon  . Cancer [facility])     throat-daignosed Nov 2013-radiation tx  . Complete heart block ([place]) 10/02/2014    s/p PPM     Social History   Social History  . Marital Status: Married    Spouse Name: N/A  . Number of Children: 2  . Years of Education: N/A   Occupational History  . Retired    Social History Main Topics  . Smoking status: Never Smoker   . Smokeless tobacco: Never Used     Comment: never used tobacco  . Alcohol Use: 0.0 oz/week    0 Standard drinks or equivalent per week     Comment: RARELY  . Drug Use: No  . Sexual Activity: Not on file   Other Topics Concern  . Not on file   Social History Narrative   CAFFEINE USE:  1 cup coffee daily   Regular exercise:  5 x weekly   Married for 1 year to a woman who is 77.     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           Past Surgical History  Procedure Laterality Date  . Colon surgery  2005    colon cancer  . Hand surgery  2009    left hand, ?carpal tunnel release  . Hernia repair  2005     ?inguinal   times 3  . Mass biopsy  12/25/2011    Procedure: NECK MASS BIOPSY;  Surgeon: Ruby Cola, MD;  Location: [place];  Service: ENT;  Laterality: Left;  Left open neck BX  . Colonoscopy w/ polypectomy    . Ep implantable device N/A 10/02/2014    Medtronic Advisa DR MRI compatable pacemaker implanted for AV block    Family History  Problem Relation Age of Onset  . Heart disease Mother     due to rheumatic fever  . Colon cancer Neg Hx   . Colon polyps Neg Hx   . Kidney disease Neg Hx   . Diabetes Neg Hx     No Known Allergies  Current Outpatient Prescriptions on File Prior to Visit  Medication Sig Dispense Refill  . atenolol (TENORMIN) 50 MG tablet TAKE 1 TABLET(50 MG) BY MOUTH TWICE DAILY 60 tablet 3  . escitalopram (LEXAPRO) 10 MG tablet Take 1 tablet (10 mg total) by mouth daily. 30 tablet 0  . tamsulosin (FLOMAX) 0.4 MG CAPS capsule  Take 1 capsule (0.4 mg total) by mouth daily. 30 capsule 2  . levothyroxine (SYNTHROID) 25 MCG tablet Take 1 tablet (25 mcg total) by mouth daily before breakfast. (Patient not taking: Reported on 10/02/2015) 30 tablet 2   No current facility-administered medications on file prior to visit.    BP 164/92 mmHg  Pulse 77  Temp(Src) 98.2 F (36.8 C) (Oral)  Resp 20  Ht 5\' 8"  (1.727 m)  Wt 184 lb 9.6 oz (83.734 kg)  BMI 28.07 kg/m2  SpO2 99%        Objective:   Physical Exam  Constitutional: He is oriented to person, place, and time. He appears well-developed and well-nourished. No distress.  HENT:  Head: Normocephalic and atraumatic.  Cardiovascular: Normal rate and regular rhythm.   No murmur heard. Pulmonary/Chest: Effort normal and breath sounds normal. No respiratory distress. He has no wheezes. He has no rales.  Musculoskeletal: He exhibits no edema.  Neurological: He is alert and oriented to person, place, and time.  Skin: Skin is warm and dry.  Psychiatric: He has a normal mood and affect. His behavior is normal.  Thought content normal.          Assessment & Plan:

## 2015-10-02 NOTE — Patient Instructions (Addendum)
Please add the second dose of atenolol each day. Restart synthroid 1/2 tab once daily for thyroid. If you tolerate this dose then increase to a full tab once daily on week two.  Continue lexapro (for mood).

## 2015-10-02 NOTE — Assessment & Plan Note (Signed)
Uncontrolled.  Advised pt to add back in the PM dose of atenolol. Follow up in 2 weeks for a nurse visit.

## 2015-10-02 NOTE — Progress Notes (Signed)
Pre visit review using our clinic review tool, if applicable. No additional management support is needed unless otherwise documented below in the visit note. 

## 2015-10-07 ENCOUNTER — Telehealth: Payer: Self-pay | Admitting: Family

## 2015-10-07 NOTE — Telephone Encounter (Signed)
Spoke with pt. He states that his mouth/throat is starting to feel like it did when he was diagnosed with mouth cancer. States throat feels swollen and mouth at back of tongue is very sore. Offered pt appt for PCP to evaluate him in the office but pt states he doesn't feel like she will be able to see anything and he would prefer to check with oncologist first. Pt will call them tomorrow and let us know if evaluation with PCP will be needed.

## 2015-10-07 NOTE — Telephone Encounter (Signed)
Relation to [id number] Call back [facility]:  Reason for call:  Patient didn't want to elaborate but requested a call back from the nurse. Please advise

## 2015-10-08 NOTE — Telephone Encounter (Signed)
Noted  

## 2015-10-16 ENCOUNTER — Ambulatory Visit (INDEPENDENT_AMBULATORY_CARE_PROVIDER_SITE_OTHER): Payer: Medicare Other | Admitting: Family Medicine

## 2015-10-16 VITALS — BP 154/90 | HR 82

## 2015-10-16 DIAGNOSIS — I10 Essential (primary) hypertension: Secondary | ICD-10-CM | POA: Diagnosis not present

## 2015-10-16 MED ORDER — LISINOPRIL 5 MG PO TABS: 5.0000 mg | ORAL_TABLET | Freq: Every day | ORAL | Status: DC

## 2015-10-16 NOTE — Progress Notes (Signed)
He had been on 10 mg of lisinopril- will have him take just 5 mg and see how this does for him  BP Readings from Last 3 Encounters:  10/16/15 154/90  10/02/15 164/92  08/20/15 160/100

## 2015-10-16 NOTE — Progress Notes (Signed)
Pre visit review using our clinic review tool, if applicable. No additional management support is needed unless otherwise documented below in the visit note.  10/02/15 AVS: Return in about 2 weeks (around 10/16/2015) for for nurse visit.   Pt reports compliance w/ BID dosing of atenolol. He also reports he is planning to stop taking Lexapro because it "turns his mood in the wrong direction."  BP Readings from Last 3 Encounters:  10/16/15 154/90  10/02/15 164/92  08/20/15 160/100    Per Dr. Lorelei Pont: Recommend that we add back 5mg  of lisinopril.  If this seems to be too much can take just 1/2 (2.5mg ).  Please see Melissa in the next 3 weeks or so for follow-up.   Pt in agreement w/ plan. Medication filled to pharmacy as requested. Follow-up appt scheduled 11/13/15.  Dorrene German, RN

## 2015-10-23 ENCOUNTER — Other Ambulatory Visit (HOSPITAL_BASED_OUTPATIENT_CLINIC_OR_DEPARTMENT_OTHER): Payer: Medicare Other

## 2015-10-23 ENCOUNTER — Encounter: Payer: Self-pay | Admitting: Hematology & Oncology

## 2015-10-23 ENCOUNTER — Ambulatory Visit (HOSPITAL_BASED_OUTPATIENT_CLINIC_OR_DEPARTMENT_OTHER): Payer: Medicare Other | Admitting: Hematology & Oncology

## 2015-10-23 VITALS — BP 92/60 | HR 81 | Temp 98.2°F | Resp 16 | Ht 68.0 in | Wt 182.0 lb

## 2015-10-23 DIAGNOSIS — C01 Malignant neoplasm of base of tongue: Secondary | ICD-10-CM

## 2015-10-23 DIAGNOSIS — Z85038 Personal history of other malignant neoplasm of large intestine: Secondary | ICD-10-CM

## 2015-10-23 DIAGNOSIS — Z85810 Personal history of malignant neoplasm of tongue: Secondary | ICD-10-CM

## 2015-10-23 DIAGNOSIS — C4442 Squamous cell carcinoma of skin of scalp and neck: Secondary | ICD-10-CM

## 2015-10-23 LAB — CBC WITH DIFFERENTIAL (CANCER CENTER ONLY)
BASO#: 0 10*3/uL (ref 0.0–0.2)
BASO%: 0.4 % (ref 0.0–2.0)
EOS%: 3.8 % (ref 0.0–7.0)
Eosinophils Absolute: 0.3 10*3/uL (ref 0.0–0.5)
HCT: 46.3 % (ref 38.7–49.9)
HGB: 16.2 g/dL (ref 13.0–17.1)
LYMPH#: 1.2 10*3/uL (ref 0.9–3.3)
LYMPH%: 14.4 % (ref 14.0–48.0)
MCH: 33.1 pg (ref 28.0–33.4)
MCHC: 35 g/dL (ref 32.0–35.9)
MCV: 95 fL (ref 82–98)
MONO#: 0.8 10*3/uL (ref 0.1–0.9)
MONO%: 10 % (ref 0.0–13.0)
NEUT#: 6 10*3/uL (ref 1.5–6.5)
NEUT%: 71.4 % (ref 40.0–80.0)
Platelets: 140 10*3/uL — ABNORMAL LOW (ref 145–400)
RBC: 4.9 10*6/uL (ref 4.20–5.70)
RDW: 12.8 % (ref 11.1–15.7)
WBC: 8.3 10*3/uL (ref 4.0–10.0)

## 2015-10-23 LAB — COMPREHENSIVE METABOLIC PANEL (CC13)
A/G RATIO: 1.7 (ref 1.2–2.2)
ALK PHOS: 81 IU/L (ref 39–117)
ALT: 14 IU/L (ref 0–44)
AST: 18 IU/L (ref 0–40)
Albumin, Serum: 4 g/dL (ref 3.5–4.7)
BILIRUBIN TOTAL: 0.6 mg/dL (ref 0.0–1.2)
BUN / CREAT RATIO: 16 (ref 10–24)
BUN: 22 mg/dL (ref 8–27)
CHLORIDE: 100 mmol/L (ref 96–106)
CO2: 29 mmol/L (ref 18–29)
CREATININE: 1.4 mg/dL — AB (ref 0.76–1.27)
Calcium, Ser: 9.1 mg/dL (ref 8.6–10.2)
GFR calc Af Amer: 52 mL/min/{1.73_m2} — ABNORMAL LOW (ref >59)
GFR calc non Af Amer: 45 mL/min/{1.73_m2} — ABNORMAL LOW (ref >59)
GLOBULIN, TOTAL: 2.3 g/dL (ref 1.5–4.5)
Glucose: 127 mg/dL — ABNORMAL HIGH (ref 65–99)
POTASSIUM: 4.5 mmol/L (ref 3.5–5.2)
SODIUM: 136 mmol/L (ref 134–144)
Total Protein: 6.3 g/dL (ref 6.0–8.5)

## 2015-10-23 NOTE — Progress Notes (Signed)
Hematology and Oncology Follow Up Visit  Brandon Hensley [id number] 1927/01/13 82 y.o. 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 We saw him back in March. Overall, hemochromatosis really has not been a problem for him. He has not been phlebotomized for over a year.  He is not doing too well overall area and he feels tired. He has swelling on the left side of his face. He is worried about his cancer coming back. He is having some abdominal pain. He's not eating as much. He is losing weight. He's having some diarrhea.  He's not able to do his daily activities. He likes to do karate. He not be held to participate in karate.  He has had no bleeding. He has had no fever. He has had a little of a cough that is nonproductive.  He has had no rashes. He has had no change in medications. He has had blood pressure medications changed. His blood pressure is a little bit on the low side today. He probably needs to have is his blood pressure medications readjusted. His family doctor can do this.  Overall, his performance status is ECOG 1.  Medications:  Current outpatient prescriptions:  .  atenolol (TENORMIN) 50 MG tablet, TAKE 1 TABLET(50 MG) BY MOUTH TWICE DAILY, Disp: 60 tablet, Rfl: 3 .  lisinopril (PRINIVIL,ZESTRIL) 5 MG tablet, Take 1 tablet (5 mg total) by mouth daily., Disp: 30 tablet, Rfl: 0 .  tamsulosin (FLOMAX) 0.4 MG CAPS capsule, Take 1 capsule (0.4 mg total) by mouth daily., Disp: 30 capsule, Rfl: 2  Allergies: No Known Allergies  Past Medical History, Surgical history, Social history, and Family History were reviewed and updated.  Review of Systems: As above  Physical Exam:  height is 5\' 8"  (1.727 m) and weight is 182  lb (82.555 kg). His oral temperature is 98.2 F (36.8 C). His blood pressure is 92/60 and his pulse is 81. His respiration is 16.   Wt Readings from Last 3 Encounters:  10/23/15 182 lb (82.555 kg)  10/02/15 184 lb 9.6 oz (83.734 kg)  08/20/15 185 lb 6.4 oz (84.097 kg)     Well-developed and well-nourished white gentleman. Head and neck exam shows no ocular or oral lesions.However, there does appear to be some slight swelling and thickness of the left side of his tongue. There may be some slight fullness on the left side of his face at the angle of the jaw. There are no palpable cervical or supraclavicular lymph nodes, although there is some slight fullness on the left side of his neck. Lungs are clear. Cardiac exam regular rate and rhythm with no murmurs, rubs or bruits. Abdomen is soft. He has good bowel sounds. There is no fluid wave. There is no palpable liver or spleen tip. Back exam shows no tenderness over the spine, ribs or hips. Externally shows no clubbing, cyanosis or edema. Skin exam shows no rashes, ecchymoses or petechia. Neurological exam shows no focal neurological deficits.  Lab Results  Component Value Date   WBC 8.3 10/23/2015   HGB 16.2 10/23/2015   HCT 46.3 10/23/2015   MCV 95 10/23/2015   PLT 140* 10/23/2015     Chemistry      Component Value Date/Time  NA 136 10/23/2015 1429   NA 140 08/20/2015 0913   NA 140 07/17/2015 0855   NA 142 07/27/2008 0841   K 4.5 10/23/2015 1429   K 4.6 08/20/2015 0913   K 4.5 07/17/2015 0855   K 4.6 07/27/2008 0841   CL 100 10/23/2015 1429   CL 103 08/20/2015 0913   CL 102 10/03/2012 0925   CL 99 07/27/2008 0841   CO2 29 10/23/2015 1429   CO2 34* 08/20/2015 0913   CO2 31* 07/17/2015 0855   CO2 29 07/27/2008 0841   BUN 22 10/23/2015 1429   BUN 24* 08/20/2015 0913   BUN 19.2 07/17/2015 0855   BUN 17 07/27/2008 0841   CREATININE 1.40* 10/23/2015 1429   CREATININE 1.29 08/20/2015 0913   CREATININE 1.3 07/17/2015 0855    CREATININE 1.07 10/16/2013 0913      Component Value Date/Time   CALCIUM 9.1 10/23/2015 1429   CALCIUM 9.5 08/20/2015 0913   CALCIUM 9.4 07/17/2015 0855   CALCIUM 9.2 07/27/2008 0841   ALKPHOS 81 10/23/2015 1429   ALKPHOS 79 08/20/2015 0913   ALKPHOS 92 07/17/2015 0855   ALKPHOS 91* 07/27/2008 0841   AST 18 10/23/2015 1429   AST 20 08/20/2015 0913   AST 21 07/17/2015 0855   AST 25 07/27/2008 0841   ALT 14 10/23/2015 1429   ALT 14 08/20/2015 0913   ALT 15 07/17/2015 0855   ALT 24 07/27/2008 0841   BILITOT 0.6 10/23/2015 1429   BILITOT 0.7 08/20/2015 0913   BILITOT 0.55 07/17/2015 0855   BILITOT 1.00 07/27/2008 0841         Impression and Plan: [name] is 82 year old gentleman. He actually has hemochromatosis.  I'm a little worried about the symptoms that is having. He has had a history of multiple malignancies in the past. As such, I think that we need to do some scans on him to see if he has any recurrence of his head and neck cancer or his colon cancer. He probably is at high risk for the head and neck cancer recurrence then colon cancer.  Will get these scans set appointment the next week or so.  We will have to see what his ferritin is. I will like to think that his ferritin should be okay.  I spent about 35 minutes with him and this into all of his symptoms and complaints.  We'll plan to get him back in 6 weeks.   Volanda Napoleon, MD 6/21/20176:30 PM

## 2015-10-24 LAB — FERRITIN: FERRITIN: 61 ng/mL (ref 22–316)

## 2015-10-24 LAB — IRON AND TIBC
%SAT: 27 % (ref 20–55)
Iron: 66 ug/dL (ref 42–163)
TIBC: 246 ug/dL (ref 202–409)
UIBC: 180 ug/dL (ref 117–376)

## 2015-10-28 ENCOUNTER — Ambulatory Visit (HOSPITAL_BASED_OUTPATIENT_CLINIC_OR_DEPARTMENT_OTHER)
Admission: RE | Admit: 2015-10-28 | Discharge: 2015-10-28 | Disposition: A | Discharge status: Home / Self Care | Payer: Medicare Other | Source: Ambulatory Visit | Attending: Hematology & Oncology | Admitting: Hematology & Oncology

## 2015-10-28 ENCOUNTER — Encounter (HOSPITAL_BASED_OUTPATIENT_CLINIC_OR_DEPARTMENT_OTHER): Payer: Self-pay

## 2015-10-28 ENCOUNTER — Ambulatory Visit (INDEPENDENT_AMBULATORY_CARE_PROVIDER_SITE_OTHER): Payer: Medicare Other | Admitting: *Deleted

## 2015-10-28 DIAGNOSIS — Z85038 Personal history of other malignant neoplasm of large intestine: Secondary | ICD-10-CM

## 2015-10-28 DIAGNOSIS — I442 Atrioventricular block, complete: Secondary | ICD-10-CM

## 2015-10-28 DIAGNOSIS — I2510 Atherosclerotic heart disease of native coronary artery without angina pectoris: Secondary | ICD-10-CM | POA: Diagnosis not present

## 2015-10-28 DIAGNOSIS — K449 Diaphragmatic hernia without obstruction or gangrene: Secondary | ICD-10-CM | POA: Insufficient documentation

## 2015-10-28 DIAGNOSIS — C01 Malignant neoplasm of base of tongue: Secondary | ICD-10-CM

## 2015-10-28 DIAGNOSIS — C4442 Squamous cell carcinoma of skin of scalp and neck: Secondary | ICD-10-CM

## 2015-10-28 DIAGNOSIS — K4020 Bilateral inguinal hernia, without obstruction or gangrene, not specified as recurrent: Secondary | ICD-10-CM | POA: Diagnosis not present

## 2015-10-28 DIAGNOSIS — N400 Enlarged prostate without lower urinary tract symptoms: Secondary | ICD-10-CM | POA: Insufficient documentation

## 2015-10-28 DIAGNOSIS — R918 Other nonspecific abnormal finding of lung field: Secondary | ICD-10-CM | POA: Insufficient documentation

## 2015-10-28 DIAGNOSIS — K5730 Diverticulosis of large intestine without perforation or abscess without bleeding: Secondary | ICD-10-CM | POA: Insufficient documentation

## 2015-10-28 MED ORDER — IOPAMIDOL (ISOVUE-300) INJECTION 61%
80.0000 mL | Freq: Once | INTRAVENOUS | Status: AC | PRN
  Administered 2015-10-28: 80 mL via INTRAVENOUS

## 2015-10-28 NOTE — Progress Notes (Signed)
Remote pacemaker transmission.   

## 2015-10-29 ENCOUNTER — Telehealth: Payer: Self-pay | Admitting: *Deleted

## 2015-10-29 LAB — CUP PACEART REMOTE DEVICE CHECK
Battery Remaining Longevity: 97 mo
Battery Voltage: 3.01 V
Brady Statistic AP VS Percent: 0 %
Brady Statistic AS VS Percent: 0 %
Implantable Lead Implant Date: 20160531
Implantable Lead Location: 753859
Implantable Lead Model: 5076
Lead Channel Impedance Value: 342 Ohm
Lead Channel Impedance Value: 418 Ohm
Lead Channel Pacing Threshold Amplitude: 0.5 V
Lead Channel Pacing Threshold Amplitude: 0.625 V
Lead Channel Pacing Threshold Pulse Width: 0.4 ms
Lead Channel Sensing Intrinsic Amplitude: 2.75 mV
Lead Channel Sensing Intrinsic Amplitude: 2.75 mV
Lead Channel Sensing Intrinsic Amplitude: 6.875 mV
Lead Channel Setting Pacing Amplitude: 2.5 V
MDC IDC LEAD IMPLANT DT: 20160531
MDC IDC LEAD LOCATION: 753860
MDC IDC MSMT LEADCHNL RA PACING THRESHOLD PULSEWIDTH: 0.4 ms
MDC IDC MSMT LEADCHNL RV IMPEDANCE VALUE: 418 Ohm
MDC IDC MSMT LEADCHNL RV IMPEDANCE VALUE: 570 Ohm
MDC IDC MSMT LEADCHNL RV SENSING INTR AMPL: 6.875 mV
MDC IDC SESS DTM: 20170626120341
MDC IDC SET LEADCHNL RA PACING AMPLITUDE: 2 V
MDC IDC SET LEADCHNL RV PACING PULSEWIDTH: 0.4 ms
MDC IDC SET LEADCHNL RV SENSING SENSITIVITY: 4 mV
MDC IDC STAT BRADY AP VP PERCENT: 86.73 %
MDC IDC STAT BRADY AS VP PERCENT: 13.27 %
MDC IDC STAT BRADY RA PERCENT PACED: 86.73 %
MDC IDC STAT BRADY RV PERCENT PACED: 100 %

## 2015-10-29 NOTE — Telephone Encounter (Addendum)
Patient aware of results  ----- Message from Volanda Napoleon, MD sent at 10/28/2015  3:55 PM EDT ----- Call - NO cancer seen on the CT scan!!!  Brandon Hensley

## 2015-10-30 ENCOUNTER — Encounter: Payer: Self-pay | Admitting: Cardiology

## 2015-11-01 ENCOUNTER — Telehealth: Payer: Self-pay | Admitting: Family

## 2015-11-06 ENCOUNTER — Ambulatory Visit: Payer: Self-pay | Admitting: Family

## 2015-11-13 ENCOUNTER — Encounter: Payer: Self-pay | Admitting: Cardiology

## 2015-11-13 ENCOUNTER — Other Ambulatory Visit: Payer: Self-pay | Admitting: Family

## 2015-11-13 ENCOUNTER — Ambulatory Visit: Payer: Self-pay | Admitting: Family

## 2015-11-13 MED ORDER — TAMSULOSIN HCL 0.4 MG PO CAPS: 0.4000 mg | ORAL_CAPSULE | Freq: Every day | ORAL | Status: DC

## 2015-11-13 NOTE — Telephone Encounter (Signed)
Caller name: Iyan  Relation to pt: self Call back [phone] Pharmacy: [id number] - [place], [place] - [address] AT [address]  Reason for call: Pt requesting refill for tamsulosin (FLOMAX) 0.4 MG CAPS capsule. Pt states is completely out of meds. Please advise ASAP.

## 2015-11-13 NOTE — Telephone Encounter (Signed)
Rx sent to the pharmacy by e-script.//AB/CMA 

## 2015-11-20 NOTE — Telephone Encounter (Signed)
completed

## 2015-12-09 ENCOUNTER — Other Ambulatory Visit: Payer: Self-pay

## 2015-12-09 ENCOUNTER — Ambulatory Visit: Payer: Self-pay | Admitting: Family

## 2015-12-12 ENCOUNTER — Ambulatory Visit (HOSPITAL_BASED_OUTPATIENT_CLINIC_OR_DEPARTMENT_OTHER): Payer: Medicare Other | Admitting: Family

## 2015-12-12 ENCOUNTER — Encounter: Payer: Self-pay | Admitting: Family

## 2015-12-12 ENCOUNTER — Other Ambulatory Visit (HOSPITAL_BASED_OUTPATIENT_CLINIC_OR_DEPARTMENT_OTHER): Payer: Medicare Other

## 2015-12-12 ENCOUNTER — Ambulatory Visit (HOSPITAL_BASED_OUTPATIENT_CLINIC_OR_DEPARTMENT_OTHER): Payer: Medicare Other

## 2015-12-12 VITALS — BP 152/97 | HR 97 | Temp 98.1°F | Resp 16 | Ht 68.0 in | Wt 185.0 lb

## 2015-12-12 DIAGNOSIS — C4442 Squamous cell carcinoma of skin of scalp and neck: Secondary | ICD-10-CM

## 2015-12-12 DIAGNOSIS — R5383 Other fatigue: Secondary | ICD-10-CM | POA: Diagnosis not present

## 2015-12-12 DIAGNOSIS — C01 Malignant neoplasm of base of tongue: Secondary | ICD-10-CM

## 2015-12-12 DIAGNOSIS — I10 Essential (primary) hypertension: Secondary | ICD-10-CM | POA: Diagnosis not present

## 2015-12-12 DIAGNOSIS — Z85038 Personal history of other malignant neoplasm of large intestine: Secondary | ICD-10-CM

## 2015-12-12 LAB — IRON AND TIBC
%SAT: 32 % (ref 20–55)
IRON: 89 ug/dL (ref 42–163)
TIBC: 278 ug/dL (ref 202–409)
UIBC: 189 ug/dL (ref 117–376)

## 2015-12-12 LAB — CBC WITH DIFFERENTIAL (CANCER CENTER ONLY)
BASO#: 0 10*3/uL (ref 0.0–0.2)
BASO%: 0.3 % (ref 0.0–2.0)
EOS%: 1.7 % (ref 0.0–7.0)
Eosinophils Absolute: 0.2 10*3/uL (ref 0.0–0.5)
HEMATOCRIT: 47.2 % (ref 38.7–49.9)
HEMOGLOBIN: 16.1 g/dL (ref 13.0–17.1)
LYMPH#: 1 10*3/uL (ref 0.9–3.3)
LYMPH%: 9 % — ABNORMAL LOW (ref 14.0–48.0)
MCH: 32.6 pg (ref 28.0–33.4)
MCHC: 34.1 g/dL (ref 32.0–35.9)
MCV: 96 fL (ref 82–98)
MONO#: 1 10*3/uL — ABNORMAL HIGH (ref 0.1–0.9)
MONO%: 8.4 % (ref 0.0–13.0)
NEUT%: 80.6 % — AB (ref 40.0–80.0)
NEUTROS ABS: 9.4 10*3/uL — AB (ref 1.5–6.5)
Platelets: 138 10*3/uL — ABNORMAL LOW (ref 145–400)
RBC: 4.94 10*6/uL (ref 4.20–5.70)
RDW: 13.4 % (ref 11.1–15.7)
WBC: 11.6 10*3/uL — ABNORMAL HIGH (ref 4.0–10.0)

## 2015-12-12 LAB — COMPREHENSIVE METABOLIC PANEL
ALT: 15 U/L (ref 0–55)
ANION GAP: 9 meq/L (ref 3–11)
AST: 21 U/L (ref 5–34)
Albumin: 3.7 g/dL (ref 3.5–5.0)
Alkaline Phosphatase: 94 U/L (ref 40–150)
BUN: 16.9 mg/dL (ref 7.0–26.0)
CALCIUM: 9.6 mg/dL (ref 8.4–10.4)
CHLORIDE: 103 meq/L (ref 98–109)
CO2: 29 mEq/L (ref 22–29)
Creatinine: 1.5 mg/dL — ABNORMAL HIGH (ref 0.7–1.3)
EGFR: 42 mL/min/{1.73_m2} — ABNORMAL LOW (ref >90)
Glucose: 149 mg/dl — ABNORMAL HIGH (ref 70–140)
POTASSIUM: 4.6 meq/L (ref 3.5–5.1)
Sodium: 141 mEq/L (ref 136–145)
TOTAL PROTEIN: 6.9 g/dL (ref 6.4–8.3)
Total Bilirubin: 0.83 mg/dL (ref 0.20–1.20)

## 2015-12-12 LAB — TSH: TSH: 4.87 m[IU]/L — AB (ref 0.320–4.118)

## 2015-12-12 LAB — FERRITIN: FERRITIN: 32 ng/mL (ref 22–316)

## 2015-12-12 NOTE — Progress Notes (Signed)
Brandon Hensley presents today for phlebotomy per MD orders. Phlebotomy procedure started at 0850 and ended at 0905. 500 grams removed using 16g phlebotomy kit. Patient observed for 30 minutes after procedure without any incident. Patient tolerated procedure well. IV needle removed intact.

## 2015-12-12 NOTE — Patient Instructions (Signed)
Therapeutic Phlebotomy, Care After  Refer to this sheet in the next few weeks. These instructions provide you with information about caring for yourself after your procedure. Your health care provider may also give you more specific instructions. Your treatment has been planned according to current medical practices, but problems sometimes occur. Call your health care provider if you have any problems or questions after your procedure.  WHAT TO EXPECT AFTER THE PROCEDURE  After your procedure, it is common to have:   Light-headedness or dizziness. You may feel faint.   Nausea.   Tiredness.  HOME CARE INSTRUCTIONS  Activities   Return to your normal activities as directed by your health care provider. Most people can go back to their normal activities right away.   Avoid strenuous physical activity and heavy lifting or pulling for about 5 hours after the procedure. Do not lift anything that is heavier than 10 lb (4.5 kg).   Athletes should avoid strenuous exercise for at least 12 hours.   Change positions slowly for the remainder of the day. This will help to prevent light-headedness or fainting.   If you feel light-headed, lie down until the feeling goes away.  Eating and Drinking   Be sure to eat well-balanced meals for the next 24 hours.   Drink enough fluid to keep your urine clear or pale yellow.   Avoid drinking alcohol on the day that you had the procedure.  Care of the Needle Insertion Site   Keep your bandage dry. You can remove the bandage after about 5 hours or as directed by your health care provider.   If you have bleeding from the needle insertion site, elevate your arm and press firmly on the site until the bleeding stops.   If you have bruising at the site, apply ice to the area:   Put ice in a plastic bag.   Place a towel between your skin and the bag.   Leave the ice on for 20 minutes, 2-3 times a day for the first 24 hours.   If the swelling does not go away after 24 hours, apply  a warm, moist washcloth to the area for 20 minutes, 2-3 times a day.  General Instructions   Avoid smoking for at least 30 minutes after the procedure.   Keep all follow-up visits as directed by your health care provider. It is important to continue with further therapeutic phlebotomy treatments as directed.  SEEK MEDICAL CARE IF:   You have redness, swelling, or pain at the needle insertion site.   You have fluid, blood, or pus coming from the needle insertion site.   You feel light-headed, dizzy, or nauseated, and the feeling does not go away.   You notice new bruising at the needle insertion site.   You feel weaker than normal.   You have a fever or chills.  SEEK IMMEDIATE MEDICAL CARE IF:   You have severe nausea or vomiting.   You have chest pain.   You have trouble breathing.    This information is not intended to replace advice given to you by your health care provider. Make sure you discuss any questions you have with your health care provider.    Document Released: 09/22/2010 Document Revised: 09/04/2014 Document Reviewed: 04/16/2014  Elsevier Interactive Patient Education 2016 Elsevier Inc.

## 2015-12-12 NOTE — Progress Notes (Signed)
Hematology and Oncology Follow Up Visit  Brandon Hensley [id number] 03/14/1927 82 y.o. 12/12/2015   Principle Diagnosis:  Hemochromatosis (heterozygous for H63D mutation)  Current Therapy:   Phlebotomy to maintain ferritin below 100 - last one in April 2016    Interim History: Brandon Hensley is here today for a follow-up. He is feeling fatigued and his complexion is ruddy. His BP is up at 152/97 and HR 97. His last phlebotomy was in April 2016.  No fever, chills, n/v, rash, cough, dizziness, headaches, vision changes, SOB, chest pain, palpitations, abdominal pain or changes in bowel or bladder habits.  He still has difficulty swallowing and hoarseness occasionally from the surgery and radiation he had to the neck in 2013. His CT scans in June showed no evidence of recurrent tumor.  No lymphadenopathy found on exam. No episodes of bleeding, bruising or petechiae.  No swelling, tenderness, numbness or tingling in his extremities. No c/o pain at this time.  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 He really enjoys this.   Medications:    Medication List       Accurate as of 12/12/15  8:41 AM. Always use your most recent med list.          atenolol 50 MG tablet Commonly known as:  TENORMIN TAKE 1 TABLET(50 MG) BY MOUTH TWICE DAILY   lisinopril 5 MG tablet Commonly known as:  PRINIVIL,ZESTRIL Take 1 tablet (5 mg total) by mouth daily.   tamsulosin 0.4 MG Caps capsule Commonly known as:  FLOMAX Take 1 capsule (0.4 mg total) by mouth daily.       Allergies: No Known Allergies  Past Medical History, Surgical history, Social history, and Family History were reviewed and updated.  Review of Systems: All other 10 point review of systems is negative.   Physical Exam:  height is 5\' 8"  (1.727 m) and weight is 185 lb (83.9 kg). His oral temperature is 98.1 F (36.7 C). His blood pressure is 152/97 (abnormal) and his pulse is 97. His respiration is 16.   Wt  Readings from Last 3 Encounters:  12/12/15 185 lb (83.9 kg)  10/23/15 182 lb (82.6 kg)  10/02/15 184 lb 9.6 oz (83.7 kg)    Ocular: Sclerae unicteric, pupils equal, round and reactive to light Ear-nose-throat: Oropharynx clear, dentition fair Lymphatic: No cervical supraclavicular or axillary adenopathy Lungs no rales or rhonchi, good excursion bilaterally Heart regular rate and rhythm, no murmur appreciated Abd soft, nontender, positive bowel sounds, no liver or spleen tip palpated on exam MSK no focal spinal tenderness, no joint edema Neuro: non-focal, well-oriented, appropriate affect Breasts: Deferred  Lab Results  Component Value Date   WBC 11.6 (H) 12/12/2015   HGB 16.1 12/12/2015   HCT 47.2 12/12/2015   MCV 96 12/12/2015   PLT 138 (L) 12/12/2015   Lab Results  Component Value Date   FERRITIN 61 10/23/2015   IRON 66 10/23/2015   TIBC 246 10/23/2015   UIBC 180 10/23/2015   IRONPCTSAT 27 10/23/2015   Lab Results  Component Value Date   RETICCTPCT 1.57 03/11/2011   RBC 4.94 12/12/2015   RETICCTABS 81.33 03/11/2011   No results found for: KPAFRELGTCHN, LAMBDASER, KAPLAMBRATIO No results found for: IGGSERUM, IGA, IGMSERUM No results found for: TOTALPROTELP, ALBUMINELP, A1GS, A2GS, BETS, BETA2SER, GAMS, MSPIKE, SPEI   Chemistry      Component Value Date/Time   NA 136 10/23/2015 1429   NA 140 07/17/2015 0855   K 4.5  10/23/2015 1429   K 4.5 07/17/2015 0855   CL 100 10/23/2015 1429   CL 102 10/03/2012 0925   CO2 29 10/23/2015 1429   CO2 31 (H) 07/17/2015 0855   BUN 22 10/23/2015 1429   BUN 19.2 07/17/2015 0855   CREATININE 1.40 (H) 10/23/2015 1429   CREATININE 1.3 07/17/2015 0855      Component Value Date/Time   CALCIUM 9.1 10/23/2015 1429   CALCIUM 9.4 07/17/2015 0855   ALKPHOS 81 10/23/2015 1429   ALKPHOS 92 07/17/2015 0855   AST 18 10/23/2015 1429   AST 21 07/17/2015 0855   ALT 14 10/23/2015 1429   ALT 15 07/17/2015 0855   BILITOT 0.6 10/23/2015 1429     BILITOT 0.55 07/17/2015 0855     Impression and Plan: Brandon Hensley is 82 year old gentleman with hemochromatosis. He responded nicely to his last phlebotomy in April 2016. He is now symptomatic with fatigue, ruddy complexion and HTN.  With him being symptomatic, we will go ahead and phlebotomize him today. We will see what his iron studies show and if he will require another phlebotomy or if one will do.  We will plan to see him back in 3 months for labs and follow-up.  He knows to contact our office with any questions or concerns. We can certainly see him sooner if need be.   [name], NP 8/10/20178:41 AM

## 2016-01-17 ENCOUNTER — Encounter: Payer: Self-pay | Admitting: Nurse Practitioner

## 2016-01-29 NOTE — Progress Notes (Signed)
Electrophysiology Office Note Date: 01/30/2016  ID:  Brandon Hensley, DOB Nov 19, 1926, MRN [id number]  PCP: Brandon Pear., NP Primary Cardiologist: Brandon Hensley Electrophysiologist: Brandon Hensley  CC: Pacemaker follow-up  Brandon Hensley is a 82 y.o. male seen today for Brandon Hensley.  He presents today for routine electrophysiology followup.  Since last being seen in our clinic, the patient reports doing very well. He remains active at home without functional limitations.  He denies chest pain, palpitations, dyspnea, PND, orthopnea, nausea, vomiting, dizziness, syncope, edema, weight gain, or early satiety.  Device History: MDT dual chamber PPM implanted 2016 for complete heart block    Past Medical History:  Diagnosis Date  . Arthritis   . BPH (benign prostatic hypertrophy)   . Cancer ([place])    colon  . Cancer [facility])    throat-daignosed Nov 2013-radiation tx  . Colon polyp   . Complete heart block ([place]) 10/02/2014   s/p PPM  . Generalized headaches   . Hemochromatosis 08/22/2014  . Hypertension   . Kidney stones   . Polycythemia    Past Surgical History:  Procedure Laterality Date  . COLON SURGERY  2005   colon cancer  . COLONOSCOPY W/ POLYPECTOMY    . EP IMPLANTABLE DEVICE N/A 10/02/2014   Medtronic Advisa Brandon MRI compatable pacemaker implanted for AV block  . HAND SURGERY  2009   left hand, ?carpal tunnel release  . HERNIA REPAIR  2005   ?inguinal   times 3  . MASS BIOPSY  12/25/2011   Procedure: NECK MASS BIOPSY;  Surgeon: Brandon Cola, MD;  Location: [facility];  Service: ENT;  Laterality: Left;  Left open neck BX    Current Outpatient Prescriptions  Medication Sig Dispense Refill  . atenolol (TENORMIN) 25 MG tablet Take 25 mg by mouth daily.    . tamsulosin (FLOMAX) 0.4 MG CAPS capsule Take 1 capsule (0.4 mg total) by mouth daily. 30 capsule 2   No current facility-administered medications for this visit.     Allergies:   Review of patient's allergies indicates no  known allergies.   Social History: Social History   Social History  . Marital status: Married    Spouse name: N/A  . Number of children: 2  . Years of education: N/A   Occupational History  . Retired    Social History Main Topics  . Smoking status: Never Smoker  . Smokeless tobacco: Never Used     Comment: never used tobacco  . Alcohol use 0.0 oz/week     Comment: RARELY  . Drug use: No  . Sexual activity: Not on file   Other Topics Concern  . Not on file   Social History Narrative   CAFFEINE USE:  1 cup Hensley daily   Regular exercise:  5 x weekly   Married for 1 year to a woman who is 75.     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           Family History: Family History  Problem Relation Age of Onset  . Heart disease Mother     due to rheumatic fever  . Colon cancer Neg Hx   . Colon polyps Neg Hx   . Kidney disease Neg Hx   . Diabetes Neg Hx      Review of Systems: All other systems reviewed and are otherwise negative except as noted above.   Physical Exam: VS:  BP 130/86  Pulse 91   Ht 5\' 10"  (1.778 m)   Wt 188 lb 3.2 oz (85.4 kg)   SpO2 95%   BMI 27.00 kg/m  , BMI Body mass index is 27 kg/m.  GEN- The patient is elderly appearing, alert and oriented x 3 today.   HEENT: normocephalic, atraumatic; sclera clear, conjunctiva pink; hearing intact; oropharynx clear; neck supple  Lungs- Clear to ausculation bilaterally, normal work of breathing.  No wheezes, rales, rhonchi Heart- Regular rate and rhythm (paced) GI- soft, non-tender, non-distended, bowel sounds present  Extremities- no clubbing, cyanosis, or edema  MS- no significant deformity or atrophy Skin- warm and dry, no rash or lesion; PPM pocket well healed Psych- euthymic mood, full affect Neuro- strength and sensation are intact  PPM Interrogation- reviewed in detail today,  See PACEART report  EKG:  EKG is ordered today. The ekg ordered today shows  sinus rhythm with ventricular pacing   Recent Labs: 12/12/2015: ALT 15; BUN 16.9; Creatinine 1.5; HGB 16.1; Platelets 138; Potassium 4.6; Sodium 141; TSH 4.870   Wt Readings from Last 3 Encounters:  01/30/16 188 lb 3.2 oz (85.4 kg)  12/12/15 185 lb (83.9 kg)  10/23/15 182 lb (82.6 kg)     Other studies Reviewed: Additional studies/ records that were reviewed today include: Brandon Brandon Hensley office notes  Assessment and Plan:  1.  Complete heart block Normal PPM function See Pace Art report No changes today  2.  HTN Stable No change required today   Current medicines are reviewed at length with the patient today.   The patient does not have concerns regarding his medicines.  The following changes were made today:  none  Labs/ tests ordered today include: none Orders Placed This Encounter  Procedures  . EKG 12-Lead     Disposition:   Follow up with [facility], Brandon Hensley 1 year     Signed, Brandon Marshall, NP 01/30/2016 8:23 AM  [place] [address] [phone] (office) [phone] (fax)

## 2016-01-30 ENCOUNTER — Ambulatory Visit (INDEPENDENT_AMBULATORY_CARE_PROVIDER_SITE_OTHER): Payer: Medicare Other | Admitting: Nurse Practitioner

## 2016-01-30 ENCOUNTER — Encounter: Payer: Self-pay | Admitting: Nurse Practitioner

## 2016-01-30 ENCOUNTER — Encounter: Payer: Self-pay | Admitting: Internal Medicine

## 2016-01-30 VITALS — BP 130/86 | HR 91 | Ht 70.0 in | Wt 188.2 lb

## 2016-01-30 DIAGNOSIS — I442 Atrioventricular block, complete: Secondary | ICD-10-CM | POA: Diagnosis not present

## 2016-01-30 DIAGNOSIS — I10 Essential (primary) hypertension: Secondary | ICD-10-CM

## 2016-01-30 LAB — CUP PACEART INCLINIC DEVICE CHECK
Implantable Lead Implant Date: 20160531
Implantable Lead Location: 753859
Implantable Lead Location: 753860
Implantable Lead Model: 5076
MDC IDC LEAD IMPLANT DT: 20160531
MDC IDC SESS DTM: 20170928085340

## 2016-01-30 NOTE — Patient Instructions (Signed)
Medication Instructions: Your physician recommends that you continue on your current medications as directed. Please refer to the Current Medication list given to you today.   Labwork: None Ordered  Procedures/Testing: None Ordered  Follow-Up: Your physician wants you to follow-up in 1 YEAR with Dr. Rayann Heman. You will receive a reminder letter in the mail two months in advance. If you don't receive a letter, please call our office to schedule the follow-up appointment.  Remote monitoring is used to monitor your Pacemaker from home. This monitoring reduces the number of office visits required to check your device to one time per year. It allows [place] to keep an eye on the functioning of your device to ensure it is working properly. You are scheduled for a device check from home on 04/30/16. You may send your transmission at any time that day. If you have a wireless device, the transmission will be sent automatically. After your physician reviews your transmission, you will receive a postcard with your next transmission date.      Any Additional Special Instructions Will Be Listed Below (If Applicable).     If you need a refill on your cardiac medications before your next appointment, please call your pharmacy.

## 2016-02-07 ENCOUNTER — Telehealth: Payer: Self-pay | Admitting: *Deleted

## 2016-02-07 NOTE — Telephone Encounter (Signed)
Received fax from [facility] that they just filled atenolol 25mg  (take 2 tablets twice a day) and there are no refills remaining. Pt's med list indicates directions as 25mg , 1 tablet daily.  Pt indicated he was not taking 50mg  dose on 01/30/16 at cardiology visit. Pt is past due for f/u with PCP. Will need to call pt on Monday to verify how he is taking medication and to schedule f/u.

## 2016-02-10 NOTE — Telephone Encounter (Signed)
Noted  

## 2016-02-10 NOTE — Telephone Encounter (Signed)
Spoke with pt. He states he did not need refill of atenolol at this time. States he checks his BP daily at home. If BP is elevated he takes medication. If it is normal he does not take it. Takes no more than 1 tablet daily. States wife is a Marine scientist and has been checking BP. Also states he was not able to tolerate levothyroxine but doesn't remember what side effect he had. Pt declines appt at this time and states he will call us back to schedule appt.

## 2016-03-18 ENCOUNTER — Telehealth: Payer: Self-pay | Admitting: *Deleted

## 2016-03-18 ENCOUNTER — Ambulatory Visit (HOSPITAL_BASED_OUTPATIENT_CLINIC_OR_DEPARTMENT_OTHER): Payer: Medicare Other | Admitting: Hematology & Oncology

## 2016-03-18 ENCOUNTER — Other Ambulatory Visit (HOSPITAL_BASED_OUTPATIENT_CLINIC_OR_DEPARTMENT_OTHER): Payer: Medicare Other

## 2016-03-18 VITALS — BP 161/88 | Temp 98.1°F | Resp 20 | Wt 186.5 lb

## 2016-03-18 DIAGNOSIS — Z85810 Personal history of malignant neoplasm of tongue: Secondary | ICD-10-CM | POA: Diagnosis not present

## 2016-03-18 DIAGNOSIS — C61 Malignant neoplasm of prostate: Secondary | ICD-10-CM

## 2016-03-18 DIAGNOSIS — Z85038 Personal history of other malignant neoplasm of large intestine: Secondary | ICD-10-CM | POA: Diagnosis not present

## 2016-03-18 DIAGNOSIS — C4442 Squamous cell carcinoma of skin of scalp and neck: Secondary | ICD-10-CM

## 2016-03-18 DIAGNOSIS — C01 Malignant neoplasm of base of tongue: Secondary | ICD-10-CM

## 2016-03-18 LAB — CBC WITH DIFFERENTIAL (CANCER CENTER ONLY)
BASO#: 0 10*3/uL (ref 0.0–0.2)
BASO%: 0.2 % (ref 0.0–2.0)
EOS%: 3.9 % (ref 0.0–7.0)
Eosinophils Absolute: 0.3 10*3/uL (ref 0.0–0.5)
HCT: 44.9 % (ref 38.7–49.9)
HGB: 15.3 g/dL (ref 13.0–17.1)
LYMPH#: 1 10*3/uL (ref 0.9–3.3)
LYMPH%: 12.7 % — AB (ref 14.0–48.0)
MCH: 31.6 pg (ref 28.0–33.4)
MCHC: 34.1 g/dL (ref 32.0–35.9)
MCV: 93 fL (ref 82–98)
MONO#: 1 10*3/uL — ABNORMAL HIGH (ref 0.1–0.9)
MONO%: 11.7 % (ref 0.0–13.0)
NEUT#: 5.8 10*3/uL (ref 1.5–6.5)
NEUT%: 71.5 % (ref 40.0–80.0)
PLATELETS: 123 10*3/uL — AB (ref 145–400)
RBC: 4.84 10*6/uL (ref 4.20–5.70)
RDW: 13.3 % (ref 11.1–15.7)
WBC: 8.1 10*3/uL (ref 4.0–10.0)

## 2016-03-18 LAB — IRON AND TIBC
%SAT: 23 % (ref 20–55)
Iron: 72 ug/dL (ref 42–163)
TIBC: 310 ug/dL (ref 202–409)
UIBC: 238 ug/dL (ref 117–376)

## 2016-03-18 LAB — COMPREHENSIVE METABOLIC PANEL
ALT: 16 U/L (ref 0–55)
AST: 26 U/L (ref 5–34)
Albumin: 3.6 g/dL (ref 3.5–5.0)
Alkaline Phosphatase: 99 U/L (ref 40–150)
Anion Gap: 9 mEq/L (ref 3–11)
BILIRUBIN TOTAL: 0.67 mg/dL (ref 0.20–1.20)
BUN: 20.9 mg/dL (ref 7.0–26.0)
CO2: 25 meq/L (ref 22–29)
Calcium: 9.4 mg/dL (ref 8.4–10.4)
Chloride: 104 mEq/L (ref 98–109)
Creatinine: 1.6 mg/dL — ABNORMAL HIGH (ref 0.7–1.3)
EGFR: 39 mL/min/{1.73_m2} — AB (ref >90)
GLUCOSE: 114 mg/dL (ref 70–140)
Potassium: 4.6 mEq/L (ref 3.5–5.1)
SODIUM: 138 meq/L (ref 136–145)
TOTAL PROTEIN: 7 g/dL (ref 6.4–8.3)

## 2016-03-18 LAB — FERRITIN: Ferritin: 28 ng/ml (ref 22–316)

## 2016-03-18 NOTE — Telephone Encounter (Addendum)
Patient aware of results  ----- Message from [name], NP sent at 03/18/2016 11:59 AM EST ----- Regarding: Iron  Iron studies ok. No phlebotomy needed at this time. Thank you!  Sarah  ----- Message ----- From: Interface, Lab In Three Zero One Sent: 03/18/2016   8:49 AM To: [name], NP

## 2016-03-18 NOTE — Progress Notes (Signed)
Hematology and Oncology Follow Up Visit  Brandon Hensley [id number] Feb 26, 1927 82 y.o. 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 is back for follow-up. We saw him back in August. Overall, hemochromatosis really has not been a problem for him. He has not been phlebotomized for over a year. We saw him back in August, his ferritin was only 32.  Otherwise, he is doing okay. He is having some difficulty with swallowing. He does see ENT and GI. I'm unsure if these had any kind of endoscopy. He may have a stricture from past radiation therapy.   He's had no bleeding or bruising. He's had no fever. He's had no cough or shortness of breath.   He is still staying active. He still does some karate.   He's had no rashes. He's had no leg swelling.   There's been no change in bowel or bladder habits.   His last PSA that we have on him is 3.7 which was back in 2014. I believe that he is being followed by urology.   He had an CT scan of his neck back in June. He has scarring on the left side. I suspect this probably is where he is having some issues with swallowing.  Overall, his performance status is ECOG 1.  Medications:  Current Outpatient Prescriptions:  .  atenolol (TENORMIN) 25 MG tablet, Take 25 mg by mouth daily., Disp: , Rfl:  .  tamsulosin (FLOMAX) 0.4 MG CAPS capsule, Take 1 capsule (0.4 mg total) by mouth daily., Disp: 30 capsule, Rfl: 2  Allergies:  Allergies  Allergen Reactions  . Levothyroxine Other (See Comments)    "cannot take this medication and I don't remember what side effect I had"    Past Medical History, Surgical history, Social history, and Family History were reviewed and updated.  Review of Systems: As above  Physical  Exam:  weight is 186 lb 8 oz (84.6 kg). His oral temperature is 98.1 F (36.7 C). His blood pressure is 161/88 (abnormal). His respiration is 20.   Wt Readings from Last 3 Encounters:  03/18/16 186 lb 8 oz (84.6 kg)  01/30/16 188 lb 3.2 oz (85.4 kg)  12/12/15 185 lb (83.9 kg)     Well-developed and well-nourished white gentleman. Head and neck exam shows no ocular or oral lesions.However, there does appear to be some slight swelling and thickness of the left side of his tongue. There may be some slight fullness on the left side of his face at the angle of the jaw. There are no palpable cervical or supraclavicular lymph nodes, although there is some slight fullness on the left side of his neck. Lungs are clear. Cardiac exam regular rate and rhythm with no murmurs, rubs or bruits. Abdomen is soft. He has good bowel sounds. There is no fluid wave. There is no palpable liver or spleen tip. Back exam shows no tenderness over the spine, ribs or hips. Externally shows no clubbing, cyanosis or edema. Skin exam shows no rashes, ecchymoses or petechia. Neurological exam shows no focal neurological deficits.  Lab Results  Component Value Date   WBC 8.1 03/18/2016   HGB 15.3 03/18/2016   HCT 44.9 03/18/2016   MCV 93 03/18/2016   PLT 123 (L) 03/18/2016  Chemistry      Component Value Date/Time   NA 141 12/12/2015 0815   K 4.6 12/12/2015 0815   CL 100 10/23/2015 1429   CL 102 10/03/2012 0925   CO2 29 12/12/2015 0815   BUN 16.9 12/12/2015 0815   CREATININE 1.5 (H) 12/12/2015 0815      Component Value Date/Time   CALCIUM 9.6 12/12/2015 0815   ALKPHOS 94 12/12/2015 0815   AST 21 12/12/2015 0815   ALT 15 12/12/2015 0815   BILITOT 0.83 12/12/2015 0815         Impression and Plan: [name] is 82 year old gentleman. He actually has hemochromatosis.  From my point of view, everything looks pretty good. I do not see a need for any scans.  I do not think he will need be phlebotomized.  I would not think that his iron is high.  I will plan to get him back to see [place] in 3 months.    Volanda Napoleon, MD 11/15/20179:20 AM

## 2016-04-03 DIAGNOSIS — I7100 Dissection of unspecified site of aorta: Secondary | ICD-10-CM

## 2016-04-03 HISTORY — DX: Dissection of unspecified site of aorta: I71.00

## 2016-04-06 ENCOUNTER — Other Ambulatory Visit: Payer: Self-pay | Admitting: Family

## 2016-04-06 NOTE — Telephone Encounter (Signed)
Melissa--please see office note from 09/2015 and 10/2015 and advise regarding atenolol refill request.

## 2016-04-15 HISTORY — PX: AORTIC ARCH REPAIR: SHX256

## 2016-04-30 ENCOUNTER — Encounter: Payer: Medicare Other | Admitting: *Deleted

## 2016-04-30 ENCOUNTER — Telehealth: Payer: Self-pay | Admitting: Cardiology

## 2016-04-30 NOTE — Telephone Encounter (Signed)
LMOVM reminding pt to send remote transmission.   

## 2016-05-01 ENCOUNTER — Encounter: Payer: Self-pay | Admitting: Cardiology

## 2016-05-04 ENCOUNTER — Telehealth: Payer: Self-pay | Admitting: Family

## 2016-05-04 NOTE — Telephone Encounter (Signed)
Please contact patient for TCM follow up.  

## 2016-05-05 ENCOUNTER — Inpatient Hospital Stay: Payer: Self-pay | Admitting: Family

## 2016-05-06 NOTE — Telephone Encounter (Signed)
Called patients number in system. Patient answered the phone. Unable to complete information asked of him. Left message on patients wifes' phone for return call to discuss patients discharge from [facility].

## 2016-05-06 NOTE — Telephone Encounter (Signed)
Wife does not speak english. FYI.

## 2016-05-08 ENCOUNTER — Ambulatory Visit (INDEPENDENT_AMBULATORY_CARE_PROVIDER_SITE_OTHER): Payer: Medicare Other | Admitting: *Deleted

## 2016-05-08 DIAGNOSIS — I442 Atrioventricular block, complete: Secondary | ICD-10-CM | POA: Diagnosis not present

## 2016-05-08 NOTE — Progress Notes (Signed)
Remote pacemaker transmission.   

## 2016-05-13 ENCOUNTER — Encounter: Payer: Self-pay | Admitting: Cardiology

## 2016-05-14 ENCOUNTER — Telehealth: Payer: Self-pay | Admitting: Family

## 2016-05-14 NOTE — Telephone Encounter (Signed)
Patient released from the hospital declined hospital follow up and medication wellness at this time, requested we call back next week.

## 2016-05-27 ENCOUNTER — Encounter: Payer: Self-pay | Admitting: Cardiology

## 2016-05-27 ENCOUNTER — Telehealth: Payer: Self-pay | Admitting: *Deleted

## 2016-05-27 LAB — CUP PACEART REMOTE DEVICE CHECK
Battery Voltage: 3.01 V
Brady Statistic AP VP Percent: 5.3 %
Brady Statistic AS VP Percent: 94.69 %
Brady Statistic RA Percent Paced: 5.28 %
Brady Statistic RV Percent Paced: 97.11 %
Implantable Lead Implant Date: 20160531
Implantable Lead Location: 753860
Implantable Lead Model: 5076
Implantable Lead Model: 5076
Implantable Pulse Generator Implant Date: 20160531
Lead Channel Impedance Value: 323 Ohm
Lead Channel Impedance Value: 361 Ohm
Lead Channel Impedance Value: 418 Ohm
Lead Channel Pacing Threshold Amplitude: 0.625 V
Lead Channel Pacing Threshold Pulse Width: 0.4 ms
Lead Channel Sensing Intrinsic Amplitude: 9.625 mV
Lead Channel Setting Pacing Amplitude: 2.5 V
Lead Channel Setting Pacing Pulse Width: 0.4 ms
Lead Channel Setting Sensing Sensitivity: 4 mV
MDC IDC LEAD IMPLANT DT: 20160531
MDC IDC LEAD LOCATION: 753859
MDC IDC MSMT BATTERY REMAINING LONGEVITY: 76 mo
MDC IDC MSMT LEADCHNL RA IMPEDANCE VALUE: 285 Ohm
MDC IDC MSMT LEADCHNL RA SENSING INTR AMPL: 0.875 mV
MDC IDC MSMT LEADCHNL RA SENSING INTR AMPL: 0.875 mV
MDC IDC MSMT LEADCHNL RV PACING THRESHOLD AMPLITUDE: 0.625 V
MDC IDC MSMT LEADCHNL RV PACING THRESHOLD PULSEWIDTH: 0.4 ms
MDC IDC MSMT LEADCHNL RV SENSING INTR AMPL: 9.625 mV
MDC IDC SESS DTM: 20180105163937
MDC IDC SET LEADCHNL RV PACING AMPLITUDE: 2.5 V
MDC IDC STAT BRADY AP VS PERCENT: 0 %
MDC IDC STAT BRADY AS VS PERCENT: 0.01 %

## 2016-05-27 NOTE — Telephone Encounter (Signed)
Called patient and left message to return call

## 2016-05-27 NOTE — Telephone Encounter (Signed)
Pt lvm returning nurse call   CB: [phone]

## 2016-05-29 ENCOUNTER — Ambulatory Visit: Payer: Self-pay | Admitting: Family

## 2016-06-02 ENCOUNTER — Telehealth: Payer: Self-pay | Admitting: Family

## 2016-06-02 NOTE — Telephone Encounter (Signed)
Patient Name: Brandon Hensley  DOB: [birth year]    Initial Comment Caller she is calling to report a pt BP that is 80/60. He was discharge on December the 13th for a triple a repair due to a di section. Is experiencing weakness/fatigue. Says he is not drinking because of the PEG tube.   Nurse Assessment  Nurse: Julien Girt, RN, Almyra Free Date/Time Eilene Ghazi Time): 06/02/2016 10:23:34 AM  Confirm and document reason for call. If symptomatic, describe symptoms. ---Caller is a NP Monica for [facility], she is calling to report this pt's BP that is 80/60. 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 He is Is experiencing weakness and fatigue. Adds he is not drinking because of the PEG tube. States he is asymptomatic, the fatigue is still the result of his surgery. He is awake and alert, ambulating . His wife is present. Caller stated she was leaving the home, advised that I call the pt and his wife back.  Does the patient have any new or worsening symptoms? ---Yes  Will a triage be completed? ---No  Select reason for no triage. ---Other  Please document clinical information provided and list any resource used. ---I ask that she advise the wife that I will be calling back. She verbalized understanding.    Nurse: Julien Girt, RN, Almyra Free Date/Time Eilene Ghazi Time): 06/02/2016 11:05:06 AM  Confirm and document reason for call. If symptomatic, describe symptoms. ---Spoke to caller, states his feeding tube is causing his problems. The NP that was in there home was doing an insurance exam. He does not think he is getting enough nourishment but his cardio thoracic surgeon wants it left in due to weight loss. States he his mouth is dry, he is able to swish an moisten but wants to drink. He is urinating small amts. Current BP is 110/70. Adds he has swallowing difficulty due to scar tissue in his throat from radiation 5 yrs ago. He did "great" from the AAA repair, wants to see Debbrah Alar. .    Does the patient have any new or worsening symptoms? ---Yes   Will a triage be completed? ---Yes   Related visit to physician within the last 2 weeks? ---No   Does the PT have any chronic conditions? (i.e. diabetes, asthma, etc.) ---Yes   List chronic conditions. ---AAA repair 04/15/2016, Hx colon ca, Hx Throat CA   Is this a behavioral health or substance abuse call? ---No      Guidelines    Guideline Title Affirmed Question Affirmed Notes  Weakness (Generalized) and Fatigue [1] MILD weakness (i.e., does not interfere with ability to work, go to school, normal activities) AND [2] persists > 1 week    Final Disposition User   See PCP When Office is Open (within 3 days) Julien Girt, Therapist, sports, Almyra Free    Comments  I spoke to caller's wife with help of interpreter, she is spanish speaking and she states her husband is doing fine, He is driving, doing all his self care and is up and around in the home. She is following the directions for his feedings and he does get H2O bolus before and after meals 5 x a day. She does not feel he is worse and will continue to monitor his BP and sx.   Disagree/Comply: Comply

## 2016-06-02 NOTE — Telephone Encounter (Signed)
Pt has an appt to see Melissa on Friday, 06/05/16.

## 2016-06-02 NOTE — Telephone Encounter (Addendum)
[facility] note in chart.  [facility] note routed to PCP for review.

## 2016-06-02 NOTE — Telephone Encounter (Signed)
Noted.  I do not see a note from [facility].  Ashlee, could you please make sure that this was addressed?

## 2016-06-02 NOTE — Telephone Encounter (Signed)
Noted  

## 2016-06-02 NOTE — Telephone Encounter (Signed)
Home health called stating patients blood pressure was low and he needed an appointment . Nurse transferred to [facility]

## 2016-06-05 ENCOUNTER — Encounter: Payer: Self-pay | Admitting: Family

## 2016-06-05 ENCOUNTER — Ambulatory Visit (INDEPENDENT_AMBULATORY_CARE_PROVIDER_SITE_OTHER): Payer: Medicare Other | Admitting: Family

## 2016-06-05 ENCOUNTER — Telehealth: Payer: Self-pay | Admitting: Family

## 2016-06-05 VITALS — BP 111/67 | HR 80 | Temp 98.3°F | Ht 72.0 in | Wt 180.4 lb

## 2016-06-05 DIAGNOSIS — I10 Essential (primary) hypertension: Secondary | ICD-10-CM

## 2016-06-05 DIAGNOSIS — R1310 Dysphagia, unspecified: Secondary | ICD-10-CM | POA: Diagnosis not present

## 2016-06-05 NOTE — Telephone Encounter (Signed)
Called pt and pt informed that the dose for atenolol is 25 mg (1)once a day.

## 2016-06-05 NOTE — Telephone Encounter (Signed)
OK,  Can you please confirm that she is NOT giving th patient metoprolol too?  Thanks!

## 2016-06-05 NOTE — Progress Notes (Signed)
Pre visit review using our clinic review tool, if applicable. No additional management support is needed unless otherwise documented below in the visit note. 

## 2016-06-05 NOTE — Telephone Encounter (Signed)
Could you please contact the patient's wife. She speaks spanish only.  Ask her what dose of atenolol she is giving the patient and how may times a day she is giving it?  Thanks!

## 2016-06-05 NOTE — Telephone Encounter (Signed)
Could you please contact Vira Browns RN with Dr. Maudry Mayhew at [facility] [phone]) and let her know that we met with Brandon Hensley today and he told me that he is willing to follow through with the modified barium swallow, but has not yet been contacted to schedule this.

## 2016-06-05 NOTE — Telephone Encounter (Addendum)
Called below number and was transferred to [phone] and spoke with [facility]. She will forward information to Dr Raelene Bott nurse as she cannot find active orders in the system for this test. They will contact pt with outcome as well. States pt should receive call from them within 24-48 hours.

## 2016-06-05 NOTE — Progress Notes (Signed)
Subjective:    Patient ID: Brandon Hensley, male    DOB: [birth year], 83 y.o.   MRN: [id number]  HPI  Brandon Hensley is an 83 yr old male who presents today to discuss PEG tube. Since his last visit he underwent emergent repair of an aortic dissection with replacement of ascending aorta and hemi-arch.  He also had re-suspension of his aortic valve.  He had evacuation of hemopericardium with release of cardiac tamponade by Dr. Steva Ready on 04/15/16.  He reports doing very well post operatively. He apparently had a swallow study following surgery which he failed (I cannot view study in care everywhere) and had  G tube placed. Since discharge he has been strict NPO and is doing water and tube feeds through the tube.  He tells me that he really wants to resume a regular diet and have the G-Tube taken out. Tells me that Dr. Raelene Bott office has not contacted him to arrange his follow up swallow study.    Patient has had low blood pressures at home in the AM's.  80/50,90/55.  In the  Afternoons SBP ranges from 100-120.    Review of Systems    see HPI  Past Medical History:  Diagnosis Date  . Arthritis   . BPH (benign prostatic hypertrophy)   . Cancer ([place])    colon  . Cancer [facility])    throat-daignosed Nov 2013-radiation tx  . Colon polyp   . Complete heart block ([place]) 10/02/2014   s/p PPM  . Generalized headaches   . Hemochromatosis 08/22/2014  . Hypertension   . Kidney stones   . Polycythemia      Social History   Social History  . Marital status: Married    Spouse name: N/A  . Number of children: 2  . Years of education: N/A   Occupational History  . Retired    Social History Main Topics  . Smoking status: Never Smoker  . Smokeless tobacco: Never Used     Comment: never used tobacco  . Alcohol use 0.0 oz/week     Comment: RARELY  . Drug use: No  . Sexual activity: Not on file   Other Topics Concern  . Not on file   Social History Narrative   CAFFEINE USE:  1 cup coffee daily     Regular exercise:  5 x weekly   Married for 1 year to a woman who is 63.     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           Past Surgical History:  Procedure Laterality Date  . COLON SURGERY  2005   colon cancer  . COLONOSCOPY W/ POLYPECTOMY    . EP IMPLANTABLE DEVICE N/A 10/02/2014   Medtronic Advisa DR MRI compatable pacemaker implanted for AV block  . HAND SURGERY  2009   left hand, ?carpal tunnel release  . HERNIA REPAIR  2005   ?inguinal   times 3  . MASS BIOPSY  12/25/2011   Procedure: NECK MASS BIOPSY;  Surgeon: Ruby Cola, MD;  Location: [facility];  Service: ENT;  Laterality: Left;  Left open neck BX    Family History  Problem Relation Age of Onset  . Heart disease Mother     due to rheumatic fever  . Colon cancer Neg Hx   . Colon polyps Neg Hx   . Kidney disease Neg Hx   . Diabetes Neg Hx  Allergies  Allergen Reactions  . Levothyroxine Other (See Comments)    "cannot take this medication and I don't remember what side effect I had"    Current Outpatient Prescriptions on File Prior to Visit  Medication Sig Dispense Refill  . atenolol (TENORMIN) 25 MG tablet Take 25 mg by mouth daily.    Marland Kitchen atenolol (TENORMIN) 50 MG tablet TAKE 1 TABLET(50 MG) BY MOUTH TWICE DAILY 60 tablet 2  . tamsulosin (FLOMAX) 0.4 MG CAPS capsule Take 1 capsule (0.4 mg total) by mouth daily. 30 capsule 2   No current facility-administered medications on file prior to visit.     BP 111/67 (BP Location: Left Arm, Cuff Size: Normal)   Pulse 80   Temp 98.3 F (36.8 C) (Oral)   Ht 6' (1.829 m)   Wt 180 lb 6.4 oz (81.8 kg)   SpO2 98%   BMI 24.47 kg/m    Objective:   Physical Exam  Constitutional: He is oriented to person, place, and time. He appears well-developed and well-nourished. No distress.  HENT:  Head: Normocephalic and atraumatic.  Hoarse voice.   Cardiovascular: Normal rate and regular rhythm.   No murmur heard. Pulmonary/Chest:  Effort normal and breath sounds normal. No respiratory distress. He has no wheezes. He has no rales.  Musculoskeletal: He exhibits no edema.  Neurological: He is alert and oriented to person, place, and time.  Skin: Skin is warm and dry.  Psychiatric: He has a normal mood and affect. His behavior is normal. Thought content normal.          Assessment & Plan:  Dysphagia-  Reviewed phone note in care everywhere-  Vira Browns RN with Dr. Steva Ready wrote, "Patient called on 05/28/16 to report that he is "ready to get this tube out." He said that he is 83 yrs old and knows what he can eat. Explained to him that he was aspirating on his FEES and PFS and it would dangerous, even deadly if he ate and aspirated the whole time. He said he has had this problem for years and did fine. Explained to him that his swallowing was worse after surgery and he would not be able to do what he did before without speech therapy. He refused to go to speech therapy at discharge and has refused another swallow study. Explained to him that there is no way to know if things improved without another swallow study but he said "I don't care." Talked to Dr. Steva Ready and he will call the pt."  Since he tells me that he is now willing to proceed with the swallow study, we will notify their team. I see that they placed an order for speech therapy with [facility] on 1/26.    I did explain to the patient that they would unlikely take out his G-tube until they reviewed the results of his follow up swallow study.  Told him that I would defer this to Dr. Raelene Bott team. 15 minutes spent with pt today.  >50% of this time was spent counseling patient on aspiration risks and nutritional counseling.    HTN- BP is a bit soft in the AM's per home readings. He is unclear what dose of atenolol he is taking. I will ask someone to contact his wife who is spanish speaking only to verify and then we can adjust his medication.

## 2016-06-05 NOTE — Telephone Encounter (Signed)
Notified pt. 

## 2016-06-08 MED ORDER — METOPROLOL TARTRATE 25 MG PO TABS: 25.0000 mg | ORAL_TABLET | Freq: Two times a day (BID) | ORAL | Status: DC

## 2016-06-08 NOTE — Telephone Encounter (Signed)
Left message for pt to return my call. Per verbal from PCP, f/u in 1 month should be with her.

## 2016-06-08 NOTE — Telephone Encounter (Signed)
Patient told me that he only takes the medications that his wife gives him.   Please advise patient  that I would like to change his metoprolol to 1 tablet in the AM and 1 tablet in the PM.  He should not take atenolol.  Patient should follow back up in 1 month for bp recheck. I would recommend that they check with with Dr. Steva Ready re: his tube feeds and timing since they are managing this.

## 2016-06-08 NOTE — Telephone Encounter (Signed)
Notified pt and he voices understanding. Already has f/u on 07/17/16 and will keep that with Melissa. Requests that we call his spouse and advise her of below as well.   Kennyth Lose-- can you call pt re: PCP's last instruction.

## 2016-06-08 NOTE — Telephone Encounter (Signed)
Tel of Harmon Pier Nurse [phone] ([place])  Spoke with Janetta Hora (nurse) who speak spanish and she states that she gives him metoprolol 1.5 mg 1 in the morning and 1 in the afternoon and that pt is also taking aspirin by instruction from other doctor, she is given him the metoprolol and not atenolol, but nurse Harmon Pier) states that he has taken sometimes meds that she does not give him, and probably it is the Atenolol that he is taking without letting her know. Harmon Pier also wants to know if she can give his milk that he takes at 4:00 am in the morning at 12:00 at night? Please advise.

## 2016-06-08 NOTE — Telephone Encounter (Signed)
Called pt and LVM to return call. 

## 2016-06-09 NOTE — Telephone Encounter (Signed)
LVM for Harmon Pier to return call (left message in [place])

## 2016-06-15 ENCOUNTER — Other Ambulatory Visit: Payer: Self-pay

## 2016-06-15 ENCOUNTER — Ambulatory Visit: Payer: Self-pay | Admitting: Hematology & Oncology

## 2016-06-25 ENCOUNTER — Other Ambulatory Visit (HOSPITAL_BASED_OUTPATIENT_CLINIC_OR_DEPARTMENT_OTHER): Payer: Medicare Other

## 2016-06-25 ENCOUNTER — Ambulatory Visit (HOSPITAL_BASED_OUTPATIENT_CLINIC_OR_DEPARTMENT_OTHER): Payer: Medicare Other | Admitting: Hematology & Oncology

## 2016-06-25 DIAGNOSIS — Z85810 Personal history of malignant neoplasm of tongue: Secondary | ICD-10-CM | POA: Diagnosis not present

## 2016-06-25 DIAGNOSIS — Z85038 Personal history of other malignant neoplasm of large intestine: Secondary | ICD-10-CM | POA: Diagnosis not present

## 2016-06-25 DIAGNOSIS — C61 Malignant neoplasm of prostate: Secondary | ICD-10-CM

## 2016-06-25 DIAGNOSIS — C4442 Squamous cell carcinoma of skin of scalp and neck: Secondary | ICD-10-CM

## 2016-06-25 LAB — CBC WITH DIFFERENTIAL (CANCER CENTER ONLY)
BASO#: 0 10*3/uL (ref 0.0–0.2)
BASO%: 0.4 % (ref 0.0–2.0)
EOS%: 5.9 % (ref 0.0–7.0)
Eosinophils Absolute: 0.4 10*3/uL (ref 0.0–0.5)
HCT: 37.7 % — ABNORMAL LOW (ref 38.7–49.9)
HGB: 11.9 g/dL — ABNORMAL LOW (ref 13.0–17.1)
LYMPH#: 1.6 10*3/uL (ref 0.9–3.3)
LYMPH%: 22.5 % (ref 14.0–48.0)
MCH: 28.7 pg (ref 28.0–33.4)
MCHC: 31.6 g/dL — ABNORMAL LOW (ref 32.0–35.9)
MCV: 91 fL (ref 82–98)
MONO#: 0.7 10*3/uL (ref 0.1–0.9)
MONO%: 9.8 % (ref 0.0–13.0)
NEUT#: 4.5 10*3/uL (ref 1.5–6.5)
NEUT%: 61.4 % (ref 40.0–80.0)
PLATELETS: 223 10*3/uL (ref 145–400)
RBC: 4.14 10*6/uL — ABNORMAL LOW (ref 4.20–5.70)
RDW: 14.6 % (ref 11.1–15.7)
WBC: 7.3 10*3/uL (ref 4.0–10.0)

## 2016-06-25 LAB — COMPREHENSIVE METABOLIC PANEL (CC13)
ALK PHOS: 129 IU/L — AB (ref 39–117)
ALT: 51 IU/L — AB (ref 0–44)
AST: 36 IU/L (ref 0–40)
Albumin, Serum: 3.9 g/dL (ref 3.5–4.7)
Albumin/Globulin Ratio: 1.3 (ref 1.2–2.2)
BUN/Creatinine Ratio: 33 — ABNORMAL HIGH (ref 10–24)
BUN: 39 mg/dL — ABNORMAL HIGH (ref 8–27)
Bilirubin Total: 0.4 mg/dL (ref 0.0–1.2)
CALCIUM: 9.8 mg/dL (ref 8.6–10.2)
CO2: 32 mmol/L — AB (ref 18–29)
CREATININE: 1.18 mg/dL (ref 0.76–1.27)
Chloride, Ser: 96 mmol/L (ref 96–106)
GFR calc Af Amer: 64 (ref >59)
GFR calc non Af Amer: 55 — ABNORMAL LOW (ref >59)
GLOBULIN, TOTAL: 3 (ref 1.5–4.5)
GLUCOSE: 128 mg/dL — AB (ref 65–99)
Potassium, Ser: 4.5 mmol/L (ref 3.5–5.2)
SODIUM: 134 mmol/L (ref 134–144)
Total Protein: 6.9 g/dL (ref 6.0–8.5)

## 2016-06-25 NOTE — Progress Notes (Signed)
Hematology and Oncology Follow Up Visit  Brandon Hensley [id number] 1926-07-07 83 y.o. 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 Shockingly enough, he had an aortic dissection. This was back in November. He had emergency surgery at [facility]. Was even more surprising is that he survived all of this.  His problem now is that he has a feeding tube in. I'm not sure as to why this is in. He is able to eat. He has had the past history of squamous cell carcinoma of the tongue. However, this really has not bothered him. He wants to have the feeding tube out. I told him that he'll have to go back to the surgeon to have this done.  Otherwise, he is really done quite well. His hemochromatosis is not been a problem. I really doubt that he has issue with his iron levels. With the aortic dissection, I'm sure that he bled himself. We saw him back in November, his ferritin was 28 with an iron saturation of 23%.   He seems to be having no problems going to the bathroom. He's had no cough. He's had no leg swelling. He's had no rashes.   There's been no headache.   Overall, his performance status is ECOG 1.  Medications:  Current Outpatient Prescriptions:  .  atorvastatin (LIPITOR) 10 MG tablet, Take 10 mg by mouth daily., Disp: , Rfl:  .  doxazosin (CARDURA) 2 MG tablet, Take 2 mg by mouth daily., Disp: , Rfl:  .  iron polysaccharides (NIFEREX) 150 MG capsule, Take 150 mg by mouth daily., Disp: , Rfl:  .  metoprolol tartrate (LOPRESSOR) 25 MG tablet, Take 1 tablet (25 mg total) by mouth 2 (two) times daily., Disp: , Rfl:  .  tamsulosin (FLOMAX) 0.4 MG CAPS capsule, Take 1 capsule (0.4 mg total) by mouth daily., Disp: 30 capsule, Rfl:  2  Allergies:  Allergies  Allergen Reactions  . Levothyroxine Other (See Comments)    "cannot take this medication and I don't remember what side effect I had"    Past Medical History, Surgical history, Social history, and Family History were reviewed and updated.  Review of Systems: As above  Physical Exam:  weight is 180 lb (81.6 kg). His oral temperature is 98 F (36.7 C). His blood pressure is 154/82 (abnormal) and his pulse is 80.   Wt Readings from Last 3 Encounters:  06/25/16 180 lb (81.6 kg)  06/05/16 180 lb 6.4 oz (81.8 kg)  03/18/16 186 lb 8 oz (84.6 kg)     Well-developed and well-nourished white gentleman. Head and neck exam shows no ocular or oral lesions.However, there does appear to be some slight swelling and thickness of the left side of his tongue. There may be some slight fullness on the left side of his face at the angle of the jaw. There are no palpable cervical or supraclavicular lymph nodes, although there is some slight fullness on the left side of his neck. Lungs are clear. Cardiac exam regular rate and rhythm with no murmurs, rubs or bruits. Abdomen is soft. He has good bowel sounds. There is no fluid wave. There is no palpable liver or spleen tip. Back exam shows no tenderness over the spine, ribs or  hips. Externally shows no clubbing, cyanosis or edema. Skin exam shows no rashes, ecchymoses or petechia. Neurological exam shows no focal neurological deficits.  Lab Results  Component Value Date   WBC 7.3 06/25/2016   HGB 11.9 (L) 06/25/2016   HCT 37.7 (L) 06/25/2016   MCV 91 06/25/2016   PLT 223 06/25/2016     Chemistry      Component Value Date/Time   NA 138 03/18/2016 0804   K 4.6 03/18/2016 0804   CL 100 10/23/2015 1429   CL 102 10/03/2012 0925   CO2 25 03/18/2016 0804   BUN 20.9 03/18/2016 0804   CREATININE 1.6 (H) 03/18/2016 0804      Component Value Date/Time   CALCIUM 9.4 03/18/2016 0804   ALKPHOS 99 03/18/2016 0804   AST 26 03/18/2016  0804   ALT 16 03/18/2016 0804   BILITOT 0.67 03/18/2016 0804         Impression and Plan: Brandon Hensley is 83 year old gentleman. He actually has hemochromatosis.  From my point of view, everything looks pretty good.  I think the fact that he got through this aortic dissection in one piece and looks as good as it looks, I am very impressed.  I think we can probably get him back in 4 months.   Volanda Napoleon, MD 2/22/20182:14 PM

## 2016-06-26 LAB — FERRITIN: FERRITIN: 39 ng/mL (ref 22–316)

## 2016-06-26 LAB — IRON AND TIBC
%SAT: 16 % — ABNORMAL LOW (ref 20–55)
Iron: 54 ug/dL (ref 42–163)
TIBC: 349 ug/dL (ref 202–409)
UIBC: 295 ug/dL (ref 117–376)

## 2016-06-26 LAB — PSA: PROSTATE SPECIFIC AG, SERUM: 4.9 ng/mL — AB (ref 0.0–4.0)

## 2016-07-17 ENCOUNTER — Encounter: Payer: Self-pay | Admitting: Family

## 2016-07-17 ENCOUNTER — Ambulatory Visit (INDEPENDENT_AMBULATORY_CARE_PROVIDER_SITE_OTHER): Payer: Medicare Other | Admitting: Family

## 2016-07-17 VITALS — BP 152/88 | HR 81 | Temp 97.9°F | Resp 18 | Ht 68.0 in | Wt 178.0 lb

## 2016-07-17 DIAGNOSIS — R1310 Dysphagia, unspecified: Secondary | ICD-10-CM

## 2016-07-17 DIAGNOSIS — R739 Hyperglycemia, unspecified: Secondary | ICD-10-CM

## 2016-07-17 DIAGNOSIS — I10 Essential (primary) hypertension: Secondary | ICD-10-CM

## 2016-07-17 LAB — BASIC METABOLIC PANEL
BUN: 31 mg/dL — AB (ref 6–23)
CALCIUM: 9.7 mg/dL (ref 8.4–10.5)
CO2: 30 mEq/L (ref 19–32)
CREATININE: 1.32 mg/dL (ref 0.40–1.50)
Chloride: 99 mEq/L (ref 96–112)
GFR: 54.45 mL/min — AB (ref >60.00)
Glucose, Bld: 90 mg/dL (ref 70–99)
Potassium: 4.8 mEq/L (ref 3.5–5.1)
Sodium: 135 mEq/L (ref 135–145)

## 2016-07-17 LAB — HEMOGLOBIN A1C: HEMOGLOBIN A1C: 5.8 % (ref 4.6–6.5)

## 2016-07-17 NOTE — Progress Notes (Signed)
Pre visit review using our clinic review tool, if applicable. No additional management support is needed unless otherwise documented below in the visit note. 

## 2016-07-17 NOTE — Progress Notes (Signed)
Subjective:    Patient ID: Brandon Hensley, male    DOB: 14-Nov-1926, 83 y.o.   MRN: [id number]  HPI  Brandon Hensley is an 83 yr old male who presents today for follow up.    1) dysphagia- He reports that he is taking PO's.  He is doing tube feeds "occasionally." wants feeding tube removed and to take his risk of aspirating with PO intake.   2) HTN- current meds include metoprolol.  Wife does his medication, she does not speak english.   BP Readings from Last 3 Encounters:  07/17/16 (!) 167/94  06/25/16 (!) 154/82  06/05/16 111/67  Review of Systems See HPI  Past Medical History:  Diagnosis Date  . Aortic dissection ([place]) 04/2016  . Arthritis   . BPH (benign prostatic hypertrophy)   . Cancer ([place])    colon  . Cancer [facility])    throat-daignosed Nov 2013-radiation tx  . Colon polyp   . Complete heart block ([place]) 10/02/2014   s/p PPM  . Generalized headaches   . Hemochromatosis 08/22/2014  . Hypertension   . Kidney stones   . Polycythemia      Social History   Social History  . Marital status: Married    Spouse name: N/A  . Number of children: 2  . Years of education: N/A   Occupational History  . Retired    Social History Main Topics  . Smoking status: Never Smoker  . Smokeless tobacco: Never Used     Comment: never used tobacco  . Alcohol use 0.0 oz/week     Comment: RARELY  . Drug use: No  . Sexual activity: Not on file   Other Topics Concern  . Not on file   Social History Narrative   CAFFEINE USE:  1 cup coffee daily   Regular exercise:  5 x weekly   Married for 1 year to a woman who is 90.     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           Past Surgical History:  Procedure Laterality Date  . AORTIC ARCH REPAIR N/A 04/15/2016  . COLON SURGERY  2005   colon cancer  . COLONOSCOPY W/ POLYPECTOMY    . EP IMPLANTABLE DEVICE N/A 10/02/2014   Medtronic Advisa DR MRI compatable pacemaker implanted for AV block  .  HAND SURGERY  2009   left hand, ?carpal tunnel release  . HERNIA REPAIR  2005   ?inguinal   times 3  . MASS BIOPSY  12/25/2011   Procedure: NECK MASS BIOPSY;  Surgeon: Ruby Cola, MD;  [facility];  Service: ENT;  Laterality: Left;  Left open neck BX    Family History  Problem Relation Age of Onset  . Heart disease Mother     due to rheumatic fever  . Colon cancer Neg Hx   . Colon polyps Neg Hx   . Kidney disease Neg Hx   . Diabetes Neg Hx     Allergies  Allergen Reactions  . Levothyroxine Other (See Comments)    "cannot take this medication and I don't remember what side effect I had"    Current Outpatient Prescriptions on File Prior to Visit  Medication Sig Dispense Refill  . atorvastatin (LIPITOR) 10 MG tablet Take 10 mg by mouth daily.    Marland Kitchen doxazosin (CARDURA) 2 MG tablet Take 2 mg by mouth daily.    . iron polysaccharides (NIFEREX) 150  MG capsule Take 150 mg by mouth daily.    . metoprolol tartrate (LOPRESSOR) 25 MG tablet Take 1 tablet (25 mg total) by mouth 2 (two) times daily.    . tamsulosin (FLOMAX) 0.4 MG CAPS capsule Take 1 capsule (0.4 mg total) by mouth daily. (Patient not taking: Reported on 07/17/2016) 30 capsule 2   No current facility-administered medications on file prior to visit.     BP (!) 152/88   Pulse 81   Temp 97.9 F (36.6 C) (Oral)   Resp 18   Ht 5\' 8"  (1.727 m)   Wt 178 lb (80.7 kg)   SpO2 97% Comment: room air  BMI 27.06 kg/m       Objective:   Physical Exam  Constitutional: He is oriented to person, place, and time. He appears well-developed and well-nourished. No distress.  HENT:  Head: Normocephalic and atraumatic.  Cardiovascular: Normal rate and regular rhythm.   No murmur heard. Pulmonary/Chest: Effort normal and breath sounds normal. No respiratory distress. He has no wheezes. He has no rales.  Musculoskeletal: He exhibits no edema.  Neurological: He is alert and oriented to person, place, and time.  Skin: Skin is  warm and dry.  Psychiatric: He has a normal mood and affect. His behavior is normal. Thought content normal.          Assessment & Plan:  Dysphagia- advised pt to follow up with surgeon to discuss removal of feeding tube. He reports that he understands risk of aspiration with PO intake and he accepts this risk.   HTN- uncontrolled. It is unclear if he is taking the proper medications. I will see if I can arrange to have a [facility] nurse go to the home to review his medications.  Once we have done this, I will consider further adjusting his medications.    Hyperglycemia- A1C stable.  Lab Results  Component Value Date   HGBA1C 5.8 07/17/2016

## 2016-07-17 NOTE — Patient Instructions (Signed)
Please complete lab work prior to leaving.   

## 2016-07-20 ENCOUNTER — Telehealth: Payer: Self-pay | Admitting: *Deleted

## 2016-07-20 NOTE — Telephone Encounter (Signed)
Faxed referral to [employer] for HTN and need to make sure pt is taking proper doses/meds. Referral was faxed to [phone] on 07/17/16. Received fax back today that patient is not a beneficiary currently attribtued to one of the [place] Registry populations. Membership roster used to verify non-eligible status.  Called [phone] and left message for call back from [facility] for explanation of above denial.

## 2016-07-24 NOTE — Telephone Encounter (Signed)
Yes please

## 2016-07-24 NOTE — Telephone Encounter (Signed)
Spoke with [place] at [facility], [phone] and verified that pt's insurance plan dose not cover [facility] services. I have sent a message to Darlina Guys with [facility] to see if they would be able to help pt. If they are not able would you want [place] to have pt come in for a nurse visit and bring all his medications with him for verification?

## 2016-07-27 NOTE — Telephone Encounter (Signed)
Spoke with pt. Scheduled nurse visit for BP and medication check on Wednesday (07/29/16) at 3:30pm. Pt will bring spouse with him as she helps manage his medications.

## 2016-07-29 ENCOUNTER — Ambulatory Visit (INDEPENDENT_AMBULATORY_CARE_PROVIDER_SITE_OTHER): Payer: Medicare Other | Admitting: Family Medicine

## 2016-07-29 VITALS — BP 118/73 | HR 65

## 2016-07-29 DIAGNOSIS — I10 Essential (primary) hypertension: Secondary | ICD-10-CM | POA: Diagnosis not present

## 2016-07-29 MED ORDER — TAMSULOSIN HCL 0.4 MG PO CAPS: 0.4000 mg | ORAL_CAPSULE | Freq: Every day | ORAL | 2 refills | Status: DC

## 2016-07-29 NOTE — Progress Notes (Signed)
Pre visit review using our clinic review tool, if applicable. No additional management support is needed unless otherwise documented below in the visit note.  Patient came in office today for blood pressure check per telephone note 07/20/16. Reviewed medications brought in from home. Patient voiced that he doesn't feel like the Doxazosin is working well for him in that he's still having issues going to the bathroom. He's requesting that his medication be switched back to Flomax. Dr. Nani Ravens was made aware of the patient's concern & request. Today's readings were: BP 127/72 P 65 & BP 118/73 P 65.  Per Dr. Nani Ravens: STOP Doxazosin & restart Flomax 0.4 MG once daily. Check blood pressure 3-4 times a week & record the readings. Return in 2-4 weeks for an office visit with PCP.  Informed patient of the provider's recommendations. He verbalized understanding and did not have any further questions or concerns before leaving the nurse visit.  FYI - Patient would like a follow-up call from PCP regarding when he can have his feeding tube removed.

## 2016-07-29 NOTE — Progress Notes (Signed)
Readings today are very good. BP goal for pt of his age is <150/90 given his age and co-morbidities. As he has done well on Flomax in past, will change from Cardura back to this as I think he has lots of wiggle room with his BP. Will have f/u in several weeks with main PCP. Agree with above.

## 2016-07-29 NOTE — Patient Instructions (Addendum)
Per Dr. Nani Ravens: STOP Doxazosin & restart Flomax 0.4 MG once daily. Check blood pressure 3-4 times a week & record the readings. Return in 2-4 weeks for an office visit with PCP.

## 2016-07-30 ENCOUNTER — Telehealth: Payer: Self-pay | Admitting: Family

## 2016-07-30 NOTE — Telephone Encounter (Addendum)
Notified pt that he should contact Dr. Steva Ready about removal of feeding tube. Pt called speech therapy requesting to get tube removed they advised pt to call PCP. I Spoke with Roselyn Reef at Dr. Raelene Bott office about pt requesting removal of tube. Jamie sent note to nurse to call pt and discuss removal of tube. Called pt to notify him that Dr. Raelene Bott office should be calling him soon. Pt voiced understanding.

## 2016-07-30 NOTE — Telephone Encounter (Signed)
Please contact patient and let him know that in regards to removal of his feeding tube, I would recommend that he contact Dr. Steva Ready, his surgeon to discuss.

## 2016-08-10 ENCOUNTER — Telehealth: Payer: Self-pay | Admitting: Cardiology

## 2016-08-10 ENCOUNTER — Ambulatory Visit (INDEPENDENT_AMBULATORY_CARE_PROVIDER_SITE_OTHER): Payer: Medicare Other | Admitting: *Deleted

## 2016-08-10 DIAGNOSIS — I442 Atrioventricular block, complete: Secondary | ICD-10-CM

## 2016-08-10 NOTE — Progress Notes (Signed)
Remote pacemaker transmission.   

## 2016-08-10 NOTE — Telephone Encounter (Signed)
LMOVM reminding pt to send remote transmission.   

## 2016-08-11 LAB — CUP PACEART REMOTE DEVICE CHECK
Battery Remaining Longevity: 82 mo
Brady Statistic AP VS Percent: 0 %
Brady Statistic AS VS Percent: 0.01 %
Date Time Interrogation Session: 20180409164042
Implantable Lead Implant Date: 20160531
Implantable Lead Location: 753859
Implantable Lead Model: 5076
Implantable Pulse Generator Implant Date: 20160531
Lead Channel Pacing Threshold Amplitude: 0.625 V
Lead Channel Pacing Threshold Pulse Width: 0.4 ms
Lead Channel Sensing Intrinsic Amplitude: 1 mV
Lead Channel Sensing Intrinsic Amplitude: 1 mV
Lead Channel Sensing Intrinsic Amplitude: 9.625 mV
Lead Channel Setting Pacing Amplitude: 2.5 V
MDC IDC LEAD IMPLANT DT: 20160531
MDC IDC LEAD LOCATION: 753860
MDC IDC MSMT BATTERY VOLTAGE: 3.01 V
MDC IDC MSMT LEADCHNL RA IMPEDANCE VALUE: 285 Ohm
MDC IDC MSMT LEADCHNL RA IMPEDANCE VALUE: 342 Ohm
MDC IDC MSMT LEADCHNL RA PACING THRESHOLD AMPLITUDE: 0.75 V
MDC IDC MSMT LEADCHNL RA PACING THRESHOLD PULSEWIDTH: 0.4 ms
MDC IDC MSMT LEADCHNL RV IMPEDANCE VALUE: 399 Ohm
MDC IDC MSMT LEADCHNL RV IMPEDANCE VALUE: 475 Ohm
MDC IDC MSMT LEADCHNL RV SENSING INTR AMPL: 9.625 mV
MDC IDC SET LEADCHNL RA PACING AMPLITUDE: 2 V
MDC IDC SET LEADCHNL RV PACING PULSEWIDTH: 0.4 ms
MDC IDC SET LEADCHNL RV SENSING SENSITIVITY: 4 mV
MDC IDC STAT BRADY AP VP PERCENT: 25.09 %
MDC IDC STAT BRADY AS VP PERCENT: 74.9 %
MDC IDC STAT BRADY RA PERCENT PACED: 25.06 %
MDC IDC STAT BRADY RV PERCENT PACED: 99.98 %

## 2016-08-12 ENCOUNTER — Encounter: Payer: Self-pay | Admitting: Cardiology

## 2016-08-26 ENCOUNTER — Encounter: Payer: Self-pay | Admitting: Family

## 2016-08-26 ENCOUNTER — Ambulatory Visit (INDEPENDENT_AMBULATORY_CARE_PROVIDER_SITE_OTHER): Payer: Medicare Other | Admitting: Family

## 2016-08-26 DIAGNOSIS — N401 Enlarged prostate with lower urinary tract symptoms: Secondary | ICD-10-CM

## 2016-08-26 DIAGNOSIS — I10 Essential (primary) hypertension: Secondary | ICD-10-CM

## 2016-08-26 MED ORDER — ATENOLOL 50 MG PO TABS: 75.0000 mg | ORAL_TABLET | Freq: Two times a day (BID) | ORAL | 3 refills | Status: DC

## 2016-08-26 NOTE — Assessment & Plan Note (Signed)
Uncontrolled.  Per office readings x 2.  Will increase atenolol from 50mg  to 75mg  twice daily. Pt advised to follow up in 2 weeks for nurse visit BP check and to bring home BP meter for comparison to this appointment.

## 2016-08-26 NOTE — Progress Notes (Signed)
Subjective:    Patient ID: Brandon Hensley, male    DOB: Sep 08, 1926, 83 y.o.   MRN: [id number]  HPI  Brandon Hensley is an 83 yr old male who presents today for follow up.   HTN- patient is taking flomax and atenolol. Blood pressure on 08/19/16 at [facility] was 160/97. Our records indicate that he should be maintained on metoprolol  25mg  BID.  Previously was on atenolol 50mg  bid. Patient changed stopped metoprolol and restarted atenolol on his own.  BP Readings from Last 3 Encounters:  08/26/16 (!) 162/88  07/29/16 118/73  07/17/16 (!) 152/88   BPH- last visit Dr. Nani Ravens d/c'd cardura and placed him on flomax.  Reports improvement in his nocturia with this change.  Reports no voiding difficulties.    PEG tube was removed on 08/17/16.  He is very pleased about this.     Review of Systems See HPI  Past Medical History:  Diagnosis Date  . Aortic dissection ([place]) 04/2016  . Arthritis   . BPH (benign prostatic hypertrophy)   . Cancer ([place])    colon  . Cancer [facility])    throat-daignosed Nov 2013-radiation tx  . Colon polyp   . Complete heart block ([place]) 10/02/2014   s/p PPM  . Generalized headaches   . Hemochromatosis 08/22/2014  . Hypertension   . Kidney stones   . Polycythemia      Social History   Social History  . Marital status: Married    Spouse name: N/A  . Number of children: 2  . Years of education: N/A   Occupational History  . Retired    Social History Main Topics  . Smoking status: Never Smoker  . Smokeless tobacco: Never Used     Comment: never used tobacco  . Alcohol use 0.0 oz/week     Comment: RARELY  . Drug use: No  . Sexual activity: Not on file   Other Topics Concern  . Not on file   Social History Narrative   CAFFEINE USE:  1 cup coffee daily   Regular exercise:  5 x weekly   Married for 1 year to a woman who is 50.     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           Past Surgical History:    Procedure Laterality Date  . AORTIC ARCH REPAIR N/A 04/15/2016  . COLON SURGERY  2005   colon cancer  . COLONOSCOPY W/ POLYPECTOMY    . EP IMPLANTABLE DEVICE N/A 10/02/2014   Medtronic Advisa DR MRI compatable pacemaker implanted for AV block  . HAND SURGERY  2009   left hand, ?carpal tunnel release  . HERNIA REPAIR  2005   ?inguinal   times 3  . MASS BIOPSY  12/25/2011   Procedure: NECK MASS BIOPSY;  Surgeon: Ruby Cola, MD;  Location: [facility] OR;  Service: ENT;  Laterality: Left;  Left open neck BX    Family History  Problem Relation Age of Onset  . Heart disease Mother     due to rheumatic fever  . Colon cancer Neg Hx   . Colon polyps Neg Hx   . Kidney disease Neg Hx   . Diabetes Neg Hx     Allergies  Allergen Reactions  . Levothyroxine Other (See Comments)    "cannot take this medication and I don't remember what side effect I had"    Current Outpatient Prescriptions on File Prior  to Visit  Medication Sig Dispense Refill  . tamsulosin (FLOMAX) 0.4 MG CAPS capsule Take 1 capsule (0.4 mg total) by mouth daily. 30 capsule 2  . aspirin 81 MG chewable tablet Chew 81 mg by mouth daily.    Marland Kitchen atorvastatin (LIPITOR) 10 MG tablet Take 10 mg by mouth daily.    . metoprolol tartrate (LOPRESSOR) 25 MG tablet Take 1 tablet (25 mg total) by mouth 2 (two) times daily. (Patient not taking: Reported on 08/26/2016)     No current facility-administered medications on file prior to visit.     BP (!) 162/88 (BP Location: Right Arm, Cuff Size: Normal)   Pulse 79   Temp 98.4 F (36.9 C) (Oral)   Resp 16   Ht 5\' 8"  (1.727 m)   Wt 179 lb 3.2 oz (81.3 kg)   BMI 27.25 kg/m       Objective:   Physical Exam  Constitutional: He is oriented to person, place, and time. He appears well-developed and well-nourished. No distress.  HENT:  Head: Normocephalic and atraumatic.  Cardiovascular: Normal rate and regular rhythm.   No murmur heard. Pulmonary/Chest: Effort normal and breath sounds  normal. No respiratory distress. He has no wheezes. He has no rales.  Musculoskeletal: He exhibits no edema.  Neurological: He is alert and oriented to person, place, and time.  Skin: Skin is warm and dry.  Psychiatric: He has a normal mood and affect. His behavior is normal. Thought content normal.          Assessment & Plan:

## 2016-08-26 NOTE — Patient Instructions (Addendum)
Please increase atenolol to 1 and 1/2 tabs twice daily. Bring your blood pressure meter with you to your follow up appointment.

## 2016-08-26 NOTE — Progress Notes (Signed)
Pre visit review using our clinic review tool, if applicable. No additional management support is needed unless otherwise documented below in the visit note. 

## 2016-08-26 NOTE — Assessment & Plan Note (Signed)
Symptoms improved on flomax. Continue same.

## 2016-09-09 ENCOUNTER — Ambulatory Visit (INDEPENDENT_AMBULATORY_CARE_PROVIDER_SITE_OTHER): Payer: Medicare Other | Admitting: Family

## 2016-09-09 VITALS — BP 149/86 | HR 61

## 2016-09-09 DIAGNOSIS — I10 Essential (primary) hypertension: Secondary | ICD-10-CM | POA: Diagnosis not present

## 2016-09-09 NOTE — Progress Notes (Signed)
Noted and agree. 

## 2016-09-09 NOTE — Progress Notes (Signed)
Pre visit review using our clinic tool,if applicable. No additional management support is needed unless otherwise documented below in the visit note.  Patient in for BP check per order from M. Edwena Blow, NP dated 08/26/16. Patient ordered to increased Atenolol 50 mg to 1.5 tablets twice daily. Patient states he takes medication dependant on what his BP is. Patient brought in readings from home. Wife takes manually. Patient states he took medications 3 hour ago.  BP readings from home are as follows: 4/28-100/70,4/29- 90/55, 4/30- 120/75, 5/1-100/60, 5/2-80/50, 5/4-110/65, 5/5-140/80, 5/6-95/60, 5/7-100/65, 5/8-130/80.  BP today = 149/86 P=61  Per M.Osullivan,NP patient to continue medications as ordered and return in 3 months for follow up visit. Patient notified. Appointment scheduled.

## 2016-09-10 NOTE — Progress Notes (Signed)
Noted and agree. 

## 2016-09-19 IMAGING — CT CT MAXILLOFACIAL W/ CM
5 of 12 series · 15 of 47 positions shown, 17 images · IV contrast (agent unspecified)
Comparison: No prior studies are available.

CLINICAL DATA: LEFT-sided facial swelling. History of LEFT tongue
cancer.

EXAM:
CT MAXILLOFACIAL WITH CONTRAST.
TECHNIQUE: Multidetector CT imaging of the maxillofacial structures was
performed with intravenous contrast. Multiplanar CT image
reconstructions were also generated. A small metallic BB was placed
on the right temple in order to reliably differentiate right from
left.
CONTRAST:  80 mL Psovue-QSS

[Series 10: cap with 2 · axial · 0.81mm/px · z∈[-837,-312]mm · 6 of 147 slices shown, 8 images]
[im 21/147  brain]
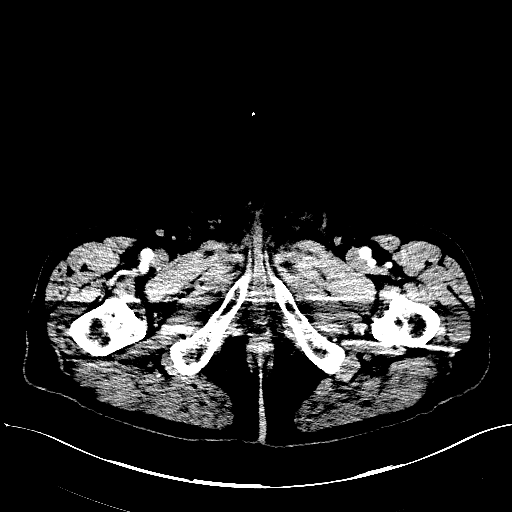
[im 21/147  bone]
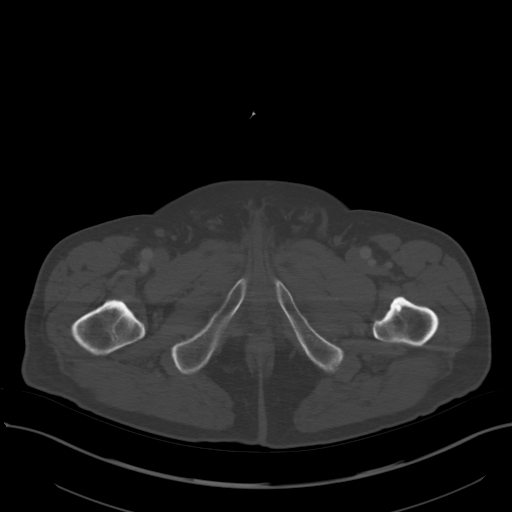
[im 42/147  bone]
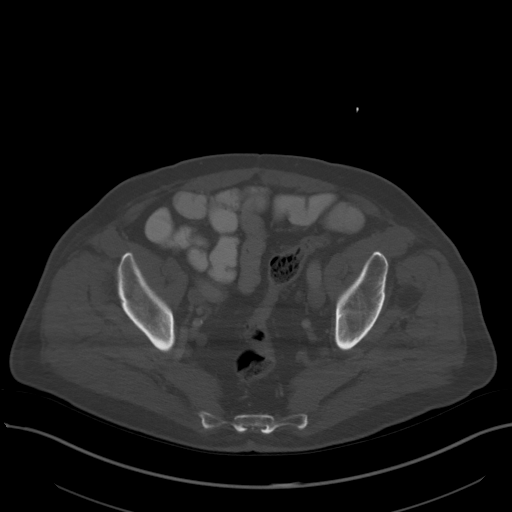
[im 63/147  bone]
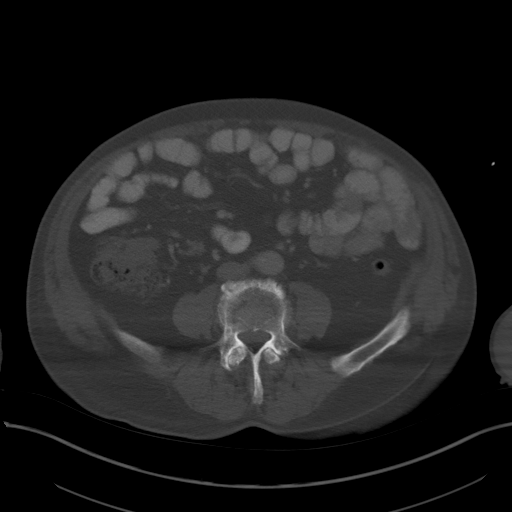
[im 84/147  bone]
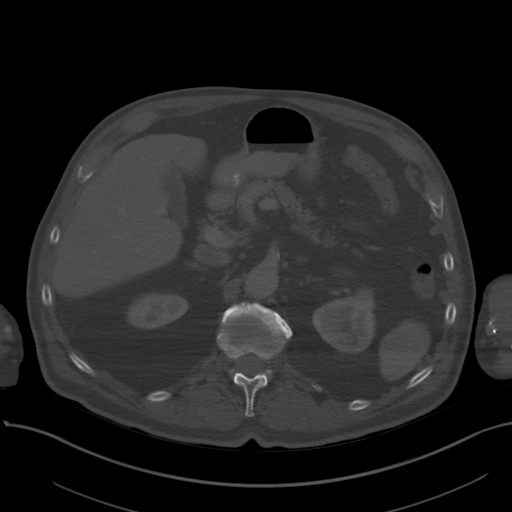
[im 105/147  brain]
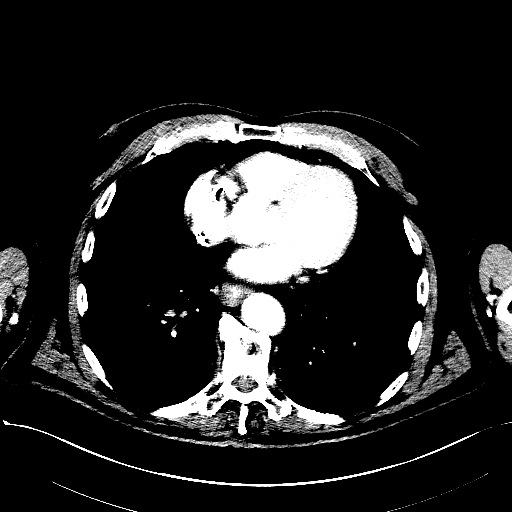
[im 105/147  bone]
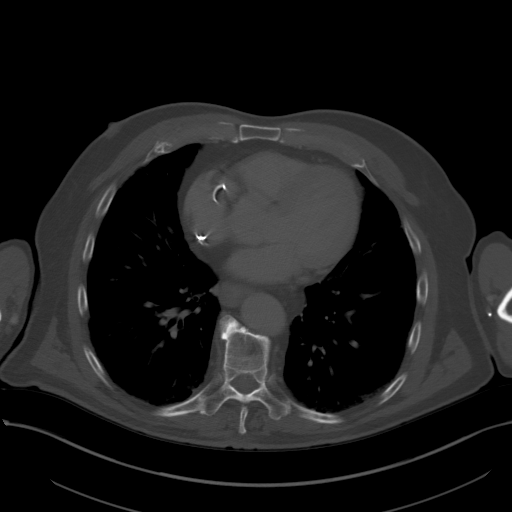
[im 126/147  bone]
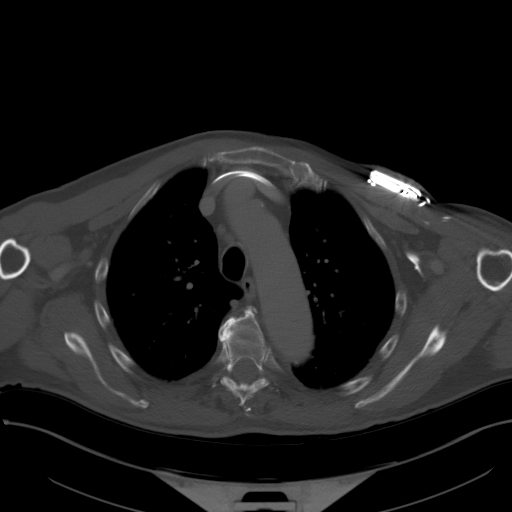

[Series 11: lung · axial · 0.81mm/px · z∈[-437,-322]mm · 2 of 69 slices shown]
[im 23/69  bone]
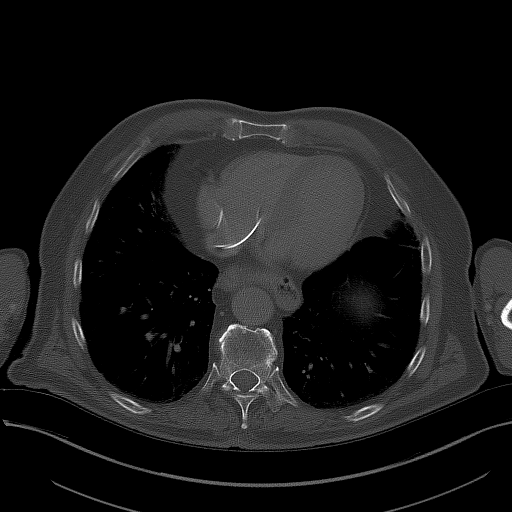
[im 46/69  bone]
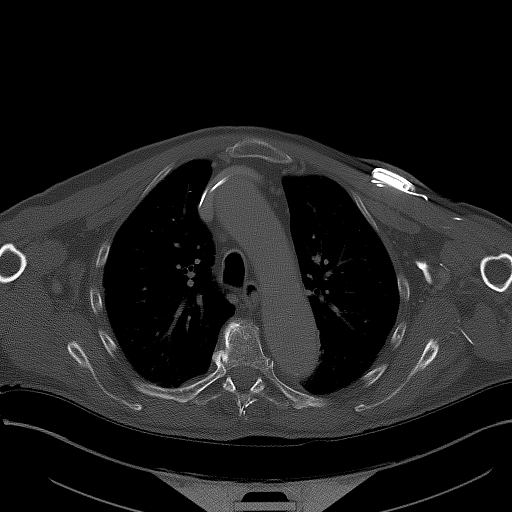

[Series 12: coronals · coronal · 1.14mm/px · 2 of 143 slices shown]
[im 48/143  bone]
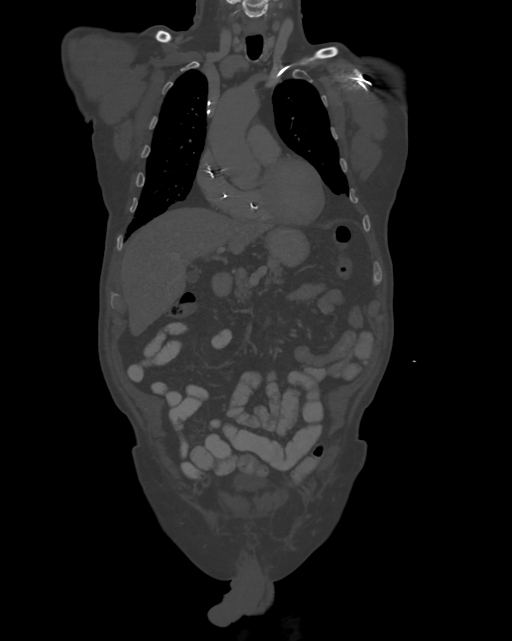
[im 95/143  bone]
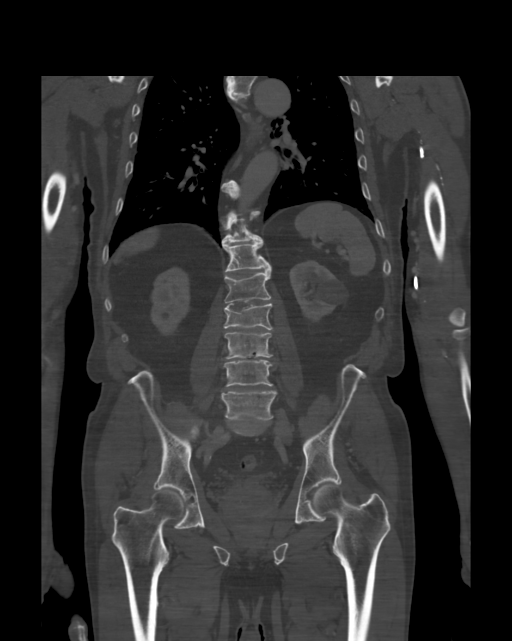

[Series 13: sagittals · sagittal · 0.81mm/px · 1 of 193 slices shown]
[im 97/193  bone]
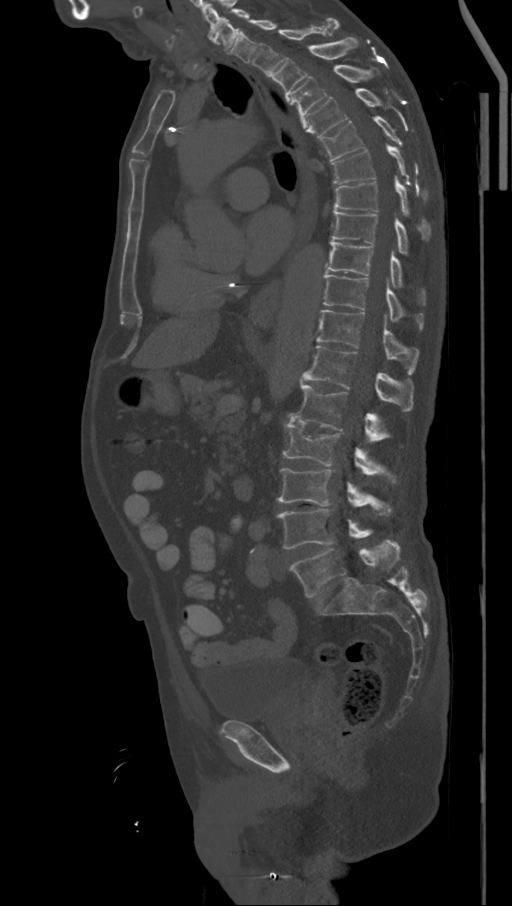

[Series 15: axial neck · axial · 0.55mm/px · z∈[-277,-145]mm · 4 of 112 slices shown]
[im 23/112  bone]
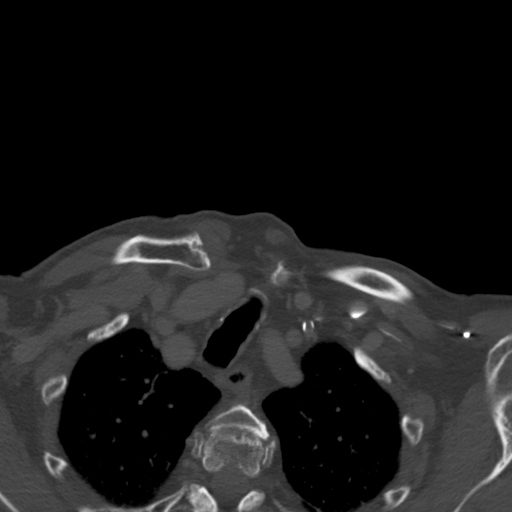
[im 45/112  bone]
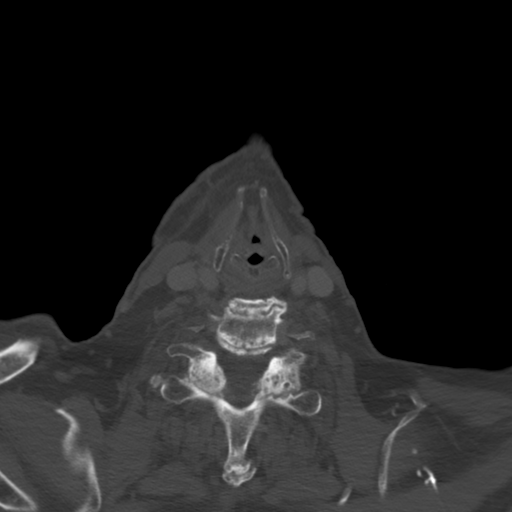
[im 67/112  bone]
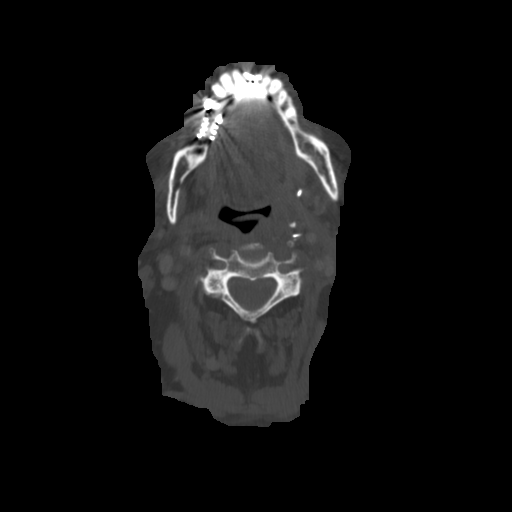
[im 89/112  bone]
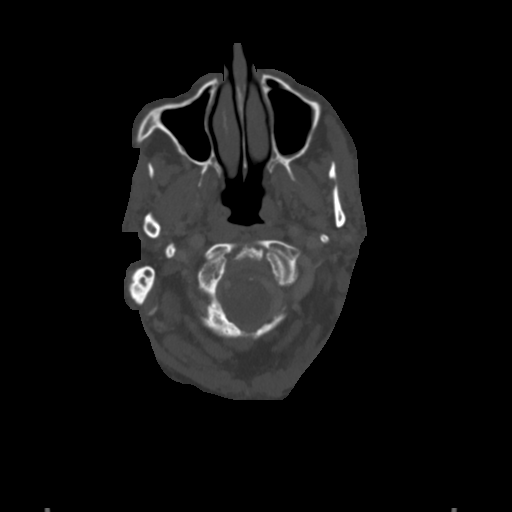

[15 of 47 positions shown; findings below may reference images not displayed]

FINDINGS: Multiple missing teeth, sequelae of periodontal disease. TMJs are
located. No osteonecrosis of the mandible. No evidence for
mandibular invasion due to tumor. Fairly symmetric fat deposits over
the cheeks, but no enhancing tumor or inflammatory process. No sinus
air-fluid level. Negative appearing orbits. No facial fracture.
Dolichoectatic cerebral vasculature.
IMPRESSION: Unremarkable CT maxillofacial. No evidence for recurrent tumor, or
post treatment complication.

## 2016-10-21 ENCOUNTER — Ambulatory Visit: Payer: Self-pay | Admitting: Family

## 2016-10-29 ENCOUNTER — Ambulatory Visit (HOSPITAL_BASED_OUTPATIENT_CLINIC_OR_DEPARTMENT_OTHER): Payer: Medicare Other | Admitting: Hematology & Oncology

## 2016-10-29 ENCOUNTER — Other Ambulatory Visit (HOSPITAL_BASED_OUTPATIENT_CLINIC_OR_DEPARTMENT_OTHER): Payer: Medicare Other

## 2016-10-29 ENCOUNTER — Other Ambulatory Visit: Payer: Self-pay | Admitting: Family

## 2016-10-29 VITALS — BP 144/87 | HR 82 | Temp 98.5°F | Resp 18 | Wt 179.0 lb

## 2016-10-29 DIAGNOSIS — C4442 Squamous cell carcinoma of skin of scalp and neck: Secondary | ICD-10-CM

## 2016-10-29 DIAGNOSIS — Z85038 Personal history of other malignant neoplasm of large intestine: Secondary | ICD-10-CM | POA: Diagnosis not present

## 2016-10-29 DIAGNOSIS — C01 Malignant neoplasm of base of tongue: Secondary | ICD-10-CM

## 2016-10-29 DIAGNOSIS — R070 Pain in throat: Secondary | ICD-10-CM

## 2016-10-29 DIAGNOSIS — Z85810 Personal history of malignant neoplasm of tongue: Secondary | ICD-10-CM

## 2016-10-29 LAB — COMPREHENSIVE METABOLIC PANEL (CC13)
ALBUMIN: 4.2 g/dL (ref 3.5–4.7)
ALK PHOS: 113 IU/L (ref 39–117)
ALT: 14 IU/L (ref 0–44)
AST: 19 IU/L (ref 0–40)
Albumin/Globulin Ratio: 1.3 (ref 1.2–2.2)
BUN/Creatinine Ratio: 19 (ref 10–24)
BUN: 27 mg/dL (ref 8–27)
Bilirubin Total: 0.3 mg/dL (ref 0.0–1.2)
CALCIUM: 10.4 mg/dL — AB (ref 8.6–10.2)
CO2: 31 mmol/L — AB (ref 20–29)
Chloride, Ser: 98 mmol/L (ref 96–106)
Creatinine, Ser: 1.43 mg/dL — ABNORMAL HIGH (ref 0.76–1.27)
GFR calc Af Amer: 51 mL/min/{1.73_m2} — ABNORMAL LOW (ref >59)
GFR, EST NON AFRICAN AMERICAN: 44 mL/min/{1.73_m2} — AB (ref >59)
GLUCOSE: 102 mg/dL — AB (ref 65–99)
Globulin, Total: 3.3 g/dL (ref 1.5–4.5)
Potassium, Ser: 4.9 mmol/L (ref 3.5–5.2)
Sodium: 134 mmol/L (ref 134–144)
Total Protein: 7.5 g/dL (ref 6.0–8.5)

## 2016-10-29 LAB — CBC WITH DIFFERENTIAL (CANCER CENTER ONLY)
BASO#: 0 10*3/uL (ref 0.0–0.2)
BASO%: 0.2 % (ref 0.0–2.0)
EOS%: 5.3 % (ref 0.0–7.0)
Eosinophils Absolute: 0.5 10*3/uL (ref 0.0–0.5)
HCT: 44.3 % (ref 38.7–49.9)
HGB: 14.5 g/dL (ref 13.0–17.1)
LYMPH#: 1.7 10*3/uL (ref 0.9–3.3)
LYMPH%: 19 % (ref 14.0–48.0)
MCH: 29.9 pg (ref 28.0–33.4)
MCHC: 32.7 g/dL (ref 32.0–35.9)
MCV: 91 fL (ref 82–98)
MONO#: 1 10*3/uL — ABNORMAL HIGH (ref 0.1–0.9)
MONO%: 11.6 % (ref 0.0–13.0)
NEUT%: 63.9 % (ref 40.0–80.0)
NEUTROS ABS: 5.7 10*3/uL (ref 1.5–6.5)
PLATELETS: 195 10*3/uL (ref 145–400)
RBC: 4.85 10*6/uL (ref 4.20–5.70)
RDW: 15.6 % (ref 11.1–15.7)
WBC: 9 10*3/uL (ref 4.0–10.0)

## 2016-10-29 NOTE — Progress Notes (Signed)
Hematology and Oncology Follow Up Visit  Brandon Hensley [id number] 09-Dec-1926 83 y.o. 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 His problem now is that he is having more pain in his mouth and upper throat. This is been going on for a few weeks.  Thankfully, he now has his feeding tube out. This was taken out about a month ago.   This pain is making it more difficult for him to eat. It seems to be on the left side of his mouth. It looks he may have a little swelling on the left side of his face.   He's had no bleeding. He's had no fever. He's had no cough. There's been no headache. His weight is doing okay.  As far as a hemochromatosis is concerned, back in their work, his ferritin was only 39 with iron saturation of 16%.  Overall, his performance status is ECOG 1.  Medications:  Current Outpatient Prescriptions:  .  aspirin 81 MG chewable tablet, Chew 81 mg by mouth daily., Disp: , Rfl:  .  atenolol (TENORMIN) 50 MG tablet, Take 1.5 tablets (75 mg total) by mouth 2 (two) times daily., Disp: 60 tablet, Rfl: 3 .  tamsulosin (FLOMAX) 0.4 MG CAPS capsule, Take 1 capsule (0.4 mg total) by mouth daily., Disp: 30 capsule, Rfl: 2  Allergies:  Allergies  Allergen Reactions  . Levothyroxine Other (See Comments)    "cannot take this medication and I don't remember what side effect I had"    Past Medical History, Surgical history, Social history, and Family History were reviewed and updated.  Review of Systems: As above  Physical Exam:  weight is 179 lb (81.2 kg). His oral temperature is 98.5 F (36.9 C). His blood pressure is 144/87 (abnormal) and his pulse is 82. His respiration is 18 and oxygen saturation is 95%.   Wt  Readings from Last 3 Encounters:  10/29/16 179 lb (81.2 kg)  08/26/16 179 lb 3.2 oz (81.3 kg)  07/17/16 178 lb (80.7 kg)     Well-developed and well-nourished white gentleman. Head and neck exam shows no ocular or oral lesions.However, there does appear to be some slight swelling and thickness of the left side of his tongue. There may be some slight fullness on the left side of his face at the angle of the jaw. There are no palpable cervical or supraclavicular lymph nodes, although there is some slight fullness on the left side of his neck. Lungs are clear. Cardiac exam regular rate and rhythm with no murmurs, rubs or bruits. Abdomen is soft. He has good bowel sounds. There is no fluid wave. There is no palpable liver or spleen tip. Back exam shows no tenderness over the spine, ribs or hips. Externally shows no clubbing, cyanosis or edema. Skin exam shows no rashes, ecchymoses or petechia. Neurological exam shows no focal neurological deficits.  Lab Results  Component Value Date   WBC 9.0 10/29/2016   HGB 14.5 10/29/2016   HCT 44.3 10/29/2016   MCV 91 10/29/2016   PLT 195 10/29/2016     Chemistry      Component Value Date/Time   NA 134 10/29/2016 1245   NA 138 03/18/2016 0804   K 4.9 10/29/2016 1245  K 4.6 03/18/2016 0804   CL 98 10/29/2016 1245   CL 102 10/03/2012 0925   CO2 31 (H) 10/29/2016 1245   CO2 25 03/18/2016 0804   BUN 27 10/29/2016 1245   BUN 20.9 03/18/2016 0804   CREATININE 1.43 (H) 10/29/2016 1245   CREATININE 1.6 (H) 03/18/2016 0804      Component Value Date/Time   CALCIUM 10.4 (H) 10/29/2016 1245   CALCIUM 9.4 03/18/2016 0804   ALKPHOS 113 10/29/2016 1245   ALKPHOS 99 03/18/2016 0804   AST 19 10/29/2016 1245   AST 26 03/18/2016 0804   ALT 14 10/29/2016 1245   ALT 16 03/18/2016 0804   BILITOT 0.3 10/29/2016 1245   BILITOT 0.67 03/18/2016 0804         Impression and Plan: Brandon Hensley is 83 year old gentleman. He actually has hemochromatosis.This  really has not been a problem for him. I'm sure that when he had this emergency surgery for the aortic dissection back in December, this take care of a lot of iron.  I am not sure what to make of this pain that is having in his mouth and throat. We probably need to check a CT scan of his face. A much or what to make of this abnormality that I see on the physical exam on the left side of his tongue. Ultimately, we may have to get him to ENT to see what is going on..  I would like to see him back in one month. This is pretty complicated. I want to make sure that we are aggressive with any intervention. He still has a very good performance status.  I spent about 40 minutes with him.   Volanda Napoleon, MD 6/28/20185:46 PM

## 2016-10-30 ENCOUNTER — Ambulatory Visit (HOSPITAL_BASED_OUTPATIENT_CLINIC_OR_DEPARTMENT_OTHER)
Admission: RE | Admit: 2016-10-30 | Discharge: 2016-10-30 | Disposition: A | Discharge status: Home / Self Care | Payer: Medicare Other | Source: Ambulatory Visit | Attending: Hematology & Oncology | Admitting: Hematology & Oncology

## 2016-10-30 ENCOUNTER — Ambulatory Visit (HOSPITAL_BASED_OUTPATIENT_CLINIC_OR_DEPARTMENT_OTHER)
Admission: RE | Admit: 2016-10-30 | Discharge: 2016-10-30 | Disposition: A | Discharge status: Home / Self Care | Payer: Medicare Other | Source: Ambulatory Visit | Attending: Family | Admitting: Family

## 2016-10-30 DIAGNOSIS — K047 Periapical abscess without sinus: Secondary | ICD-10-CM | POA: Diagnosis not present

## 2016-10-30 DIAGNOSIS — I712 Thoracic aortic aneurysm, without rupture: Secondary | ICD-10-CM | POA: Insufficient documentation

## 2016-10-30 DIAGNOSIS — C4442 Squamous cell carcinoma of skin of scalp and neck: Secondary | ICD-10-CM

## 2016-10-30 DIAGNOSIS — C01 Malignant neoplasm of base of tongue: Secondary | ICD-10-CM | POA: Diagnosis not present

## 2016-10-30 DIAGNOSIS — K0490 Unspecified diseases of pulp and periapical tissues: Secondary | ICD-10-CM | POA: Diagnosis not present

## 2016-10-30 DIAGNOSIS — K029 Dental caries, unspecified: Secondary | ICD-10-CM | POA: Diagnosis not present

## 2016-10-30 DIAGNOSIS — I7101 Dissection of thoracic aorta: Secondary | ICD-10-CM | POA: Insufficient documentation

## 2016-10-30 LAB — FERRITIN: FERRITIN: 31 ng/mL (ref 22–316)

## 2016-10-30 LAB — IRON AND TIBC
%SAT: 13 % — ABNORMAL LOW (ref 20–55)
Iron: 46 ug/dL (ref 42–163)
TIBC: 341 ug/dL (ref 202–409)
UIBC: 296 ug/dL (ref 117–376)

## 2016-10-30 MED ORDER — IOPAMIDOL (ISOVUE-300) INJECTION 61%
100.0000 mL | Freq: Once | INTRAVENOUS | Status: AC | PRN
  Administered 2016-10-30: 60 mL via INTRAVENOUS

## 2016-11-02 ENCOUNTER — Telehealth: Payer: Self-pay | Admitting: *Deleted

## 2016-11-02 ENCOUNTER — Telehealth: Payer: Self-pay | Admitting: Family

## 2016-11-02 NOTE — Telephone Encounter (Signed)
Spoke with patient and let him know his scan showed no evidence of recurrent or residual disease. He was advised to follow-up with his dentist regarding cavities and periapical abscess. He agreed to give them a call. All questions at this time were answered.

## 2016-11-02 NOTE — Telephone Encounter (Addendum)
Patient is aware of results and recommended. Patient is working with a Pharmacist, community currently  ----- Message from Volanda Napoleon, MD sent at 10/30/2016  5:12 PM EDT ----- Call and tell him that the CAT scan does not show any obvious cancer in his mouth. It does show that he has a lot of dental decay. This appears to be worsening. He really needs to see his dentist to try to help with this. If we need to we can always refer him to Dr. Lawana Chambers at the [facility] clinic. pete

## 2016-11-09 ENCOUNTER — Ambulatory Visit (INDEPENDENT_AMBULATORY_CARE_PROVIDER_SITE_OTHER): Payer: Medicare Other | Admitting: *Deleted

## 2016-11-09 DIAGNOSIS — I442 Atrioventricular block, complete: Secondary | ICD-10-CM | POA: Diagnosis not present

## 2016-11-09 NOTE — Progress Notes (Signed)
Remote pacemaker transmission.   

## 2016-11-11 LAB — CUP PACEART REMOTE DEVICE CHECK
Battery Remaining Longevity: 80 mo
Brady Statistic AP VS Percent: 0 %
Brady Statistic AS VS Percent: 0 %
Date Time Interrogation Session: 20180709135625
Implantable Lead Implant Date: 20160531
Implantable Lead Location: 753859
Lead Channel Impedance Value: 342 Ohm
Lead Channel Pacing Threshold Amplitude: 0.625 V
Lead Channel Pacing Threshold Pulse Width: 0.4 ms
Lead Channel Sensing Intrinsic Amplitude: 1.25 mV
Lead Channel Sensing Intrinsic Amplitude: 1.25 mV
Lead Channel Sensing Intrinsic Amplitude: 9.625 mV
Lead Channel Setting Pacing Amplitude: 2.5 V
MDC IDC LEAD IMPLANT DT: 20160531
MDC IDC LEAD LOCATION: 753860
MDC IDC MSMT BATTERY VOLTAGE: 3.01 V
MDC IDC MSMT LEADCHNL RA IMPEDANCE VALUE: 285 Ohm
MDC IDC MSMT LEADCHNL RA PACING THRESHOLD AMPLITUDE: 0.875 V
MDC IDC MSMT LEADCHNL RA PACING THRESHOLD PULSEWIDTH: 0.4 ms
MDC IDC MSMT LEADCHNL RV IMPEDANCE VALUE: 399 Ohm
MDC IDC MSMT LEADCHNL RV IMPEDANCE VALUE: 456 Ohm
MDC IDC MSMT LEADCHNL RV SENSING INTR AMPL: 9.625 mV
MDC IDC PG IMPLANT DT: 20160531
MDC IDC SET LEADCHNL RA PACING AMPLITUDE: 2 V
MDC IDC SET LEADCHNL RV PACING PULSEWIDTH: 0.4 ms
MDC IDC SET LEADCHNL RV SENSING SENSITIVITY: 4 mV
MDC IDC STAT BRADY AP VP PERCENT: 62.61 %
MDC IDC STAT BRADY AS VP PERCENT: 37.38 %
MDC IDC STAT BRADY RA PERCENT PACED: 62.58 %
MDC IDC STAT BRADY RV PERCENT PACED: 99.98 %

## 2016-11-13 ENCOUNTER — Encounter: Payer: Self-pay | Admitting: Cardiology

## 2016-12-10 ENCOUNTER — Other Ambulatory Visit: Payer: Self-pay | Admitting: *Deleted

## 2016-12-10 ENCOUNTER — Other Ambulatory Visit: Payer: Self-pay | Admitting: Family

## 2016-12-11 ENCOUNTER — Encounter: Payer: Self-pay | Admitting: Family

## 2016-12-11 ENCOUNTER — Ambulatory Visit (INDEPENDENT_AMBULATORY_CARE_PROVIDER_SITE_OTHER): Payer: Medicare Other | Admitting: Family

## 2016-12-11 ENCOUNTER — Ambulatory Visit (HOSPITAL_BASED_OUTPATIENT_CLINIC_OR_DEPARTMENT_OTHER): Payer: Medicare Other | Admitting: Hematology & Oncology

## 2016-12-11 ENCOUNTER — Other Ambulatory Visit (HOSPITAL_BASED_OUTPATIENT_CLINIC_OR_DEPARTMENT_OTHER): Payer: Medicare Other

## 2016-12-11 VITALS — BP 146/85 | HR 93 | Temp 98.3°F | Resp 16 | Wt 178.0 lb

## 2016-12-11 VITALS — BP 137/94 | HR 82 | Temp 98.3°F | Resp 16 | Ht 68.0 in | Wt 178.0 lb

## 2016-12-11 DIAGNOSIS — C01 Malignant neoplasm of base of tongue: Secondary | ICD-10-CM

## 2016-12-11 DIAGNOSIS — Z950 Presence of cardiac pacemaker: Secondary | ICD-10-CM | POA: Diagnosis not present

## 2016-12-11 DIAGNOSIS — N401 Enlarged prostate with lower urinary tract symptoms: Secondary | ICD-10-CM

## 2016-12-11 DIAGNOSIS — C4442 Squamous cell carcinoma of skin of scalp and neck: Secondary | ICD-10-CM

## 2016-12-11 DIAGNOSIS — Z85038 Personal history of other malignant neoplasm of large intestine: Secondary | ICD-10-CM | POA: Diagnosis not present

## 2016-12-11 DIAGNOSIS — I10 Essential (primary) hypertension: Secondary | ICD-10-CM | POA: Diagnosis not present

## 2016-12-11 DIAGNOSIS — E875 Hyperkalemia: Secondary | ICD-10-CM

## 2016-12-11 DIAGNOSIS — Z85810 Personal history of malignant neoplasm of tongue: Secondary | ICD-10-CM | POA: Diagnosis not present

## 2016-12-11 LAB — CBC WITH DIFFERENTIAL (CANCER CENTER ONLY)
BASO#: 0 10*3/uL (ref 0.0–0.2)
BASO%: 0.3 % (ref 0.0–2.0)
EOS ABS: 0.5 10*3/uL (ref 0.0–0.5)
EOS%: 5 % (ref 0.0–7.0)
HCT: 44.1 % (ref 38.7–49.9)
HGB: 14.4 g/dL (ref 13.0–17.1)
LYMPH#: 1.7 10*3/uL (ref 0.9–3.3)
LYMPH%: 16.3 % (ref 14.0–48.0)
MCH: 30.4 pg (ref 28.0–33.4)
MCHC: 32.7 g/dL (ref 32.0–35.9)
MCV: 93 fL (ref 82–98)
MONO#: 0.8 10*3/uL (ref 0.1–0.9)
MONO%: 8 % (ref 0.0–13.0)
NEUT#: 7.2 10*3/uL — ABNORMAL HIGH (ref 1.5–6.5)
NEUT%: 70.4 % (ref 40.0–80.0)
Platelets: 179 10*3/uL (ref 145–400)
RBC: 4.73 10*6/uL (ref 4.20–5.70)
RDW: 14.3 % (ref 11.1–15.7)
WBC: 10.2 10*3/uL — ABNORMAL HIGH (ref 4.0–10.0)

## 2016-12-11 LAB — CMP (CANCER CENTER ONLY)
ALBUMIN: 3.8 g/dL (ref 3.3–5.5)
ALK PHOS: 102 U/L — AB (ref 26–84)
ALT: 19 U/L (ref 10–47)
AST: 26 U/L (ref 11–38)
BUN, Bld: 32 mg/dL — ABNORMAL HIGH (ref 7–22)
CALCIUM: 9.8 mg/dL (ref 8.0–10.3)
CO2: 33 mEq/L (ref 18–33)
Chloride: 101 mEq/L (ref 98–108)
Creat: 0.9 mg/dl (ref 0.6–1.2)
GLUCOSE: 127 mg/dL — AB (ref 73–118)
POTASSIUM: 5.3 meq/L — AB (ref 3.3–4.7)
Sodium: 143 mEq/L (ref 128–145)
TOTAL PROTEIN: 7.8 g/dL (ref 6.4–8.1)
Total Bilirubin: 0.8 mg/dl (ref 0.20–1.60)

## 2016-12-11 LAB — IRON AND TIBC
%SAT: 18 % — ABNORMAL LOW (ref 20–55)
Iron: 55 ug/dL (ref 42–163)
TIBC: 316 ug/dL (ref 202–409)
UIBC: 261 ug/dL (ref 117–376)

## 2016-12-11 LAB — FERRITIN: FERRITIN: 30 ng/mL (ref 22–316)

## 2016-12-11 NOTE — Patient Instructions (Signed)
  Try to limit quantities of high potassium foods in your diet.      High potassium content foods  Highest content (>25 meq/100 g) High content (>6.2 meq/100 g)   Vegetables   Spinach   Tomatoes   Broccoli   Winter squash   Beets   Carrots   Cauliflower   Potatoes   Fruits   Bananas  Dried figs Cantaloupe  Molasses Kiwis  Seaweed Oranges  Very high content (>12.5 meq/100 g) Mangos  Dried fruits (dates, prunes) Meats  Nuts Ground beef  Avocados Steak  Bran cereals Pork  Wheat germ Veal  Lima beans Arline Asp

## 2016-12-11 NOTE — Progress Notes (Signed)
Subjective:    Patient ID: Brandon Hensley, male    DOB: 29-Nov-1926, 83 y.o.   MRN: [id number]  HPI   Patient presents today for follow-up.  Hypertension-last visit his atenolol dose was increased from 50 mg twice daily to 75 mg twice daily. He reports that sometimes he takes only 1 tab is his BP is lower.   BP Readings from Last 3 Encounters:  12/11/16 (!) 137/94  12/11/16 (!) 146/85  10/29/16 (!) 144/87   BPH-patient is maintained on Flomax. Reports voiding without difficulty.  Nocturia x 2.   History of hemochromatosis.  History of throat cancer. Status post radiation therapy 2013. Continues to follow with oncology.   history of aortic dissection-patient is status post repair with replacement of the ascending aorta April 2018. Postoperatively he required a G-tube. He is following with cardiothoracic surgery at [facility].   Review of Systems    see HPI  Past Medical History:  Diagnosis Date  . Aortic dissection ([place]) 04/2016  . Arthritis   . BPH (benign prostatic hypertrophy)   . Cancer ([place])    colon  . Cancer [facility])    throat-daignosed Nov 2013-radiation tx  . Colon polyp   . Complete heart block ([place]) 10/02/2014   s/p PPM  . Generalized headaches   . Hemochromatosis 08/22/2014  . Hypertension   . Kidney stones   . Polycythemia      Social History   Social History  . Marital status: Married    Spouse name: N/A  . Number of children: 2  . Years of education: N/A   Occupational History  . Retired    Social History Main Topics  . Smoking status: Never Smoker  . Smokeless tobacco: Never Used     Comment: never used tobacco  . Alcohol use 0.0 oz/week     Comment: RARELY  . Drug use: No  . Sexual activity: Not on file   Other Topics Concern  . Not on file   Social History Narrative   CAFFEINE USE:  1 cup coffee daily   Regular exercise:  5 x weekly   Married for 1 year to a woman who is 105.     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           Past Surgical History:  Procedure Laterality Date  . AORTIC ARCH REPAIR N/A 04/15/2016  . COLON SURGERY  2005   colon cancer  . COLONOSCOPY W/ POLYPECTOMY    . EP IMPLANTABLE DEVICE N/A 10/02/2014   Medtronic Advisa DR MRI compatable pacemaker implanted for AV block  . HAND SURGERY  2009   left hand, ?carpal tunnel release  . HERNIA REPAIR  2005   ?inguinal   times 3  . MASS BIOPSY  12/25/2011   Procedure: NECK MASS BIOPSY;  Surgeon: Ruby Cola, MD;  Location: [facility] OR;  Service: ENT;  Laterality: Left;  Left open neck BX    Family History  Problem Relation Age of Onset  . Heart disease Mother        due to rheumatic fever  . Colon cancer Neg Hx   . Colon polyps Neg Hx   . Kidney disease Neg Hx   . Diabetes Neg Hx     Allergies  Allergen Reactions  . Levothyroxine Other (See Comments)    "cannot take this medication and I don't remember what side effect I had"    Current Outpatient  Prescriptions on File Prior to Visit  Medication Sig Dispense Refill  . aspirin 81 MG chewable tablet Chew 81 mg by mouth daily.    Marland Kitchen atenolol (TENORMIN) 50 MG tablet Take 1.5 tablets (75 mg total) by mouth 2 (two) times daily. 60 tablet 3  . tamsulosin (FLOMAX) 0.4 MG CAPS capsule Take 1 capsule (0.4 mg total) by mouth daily. 30 capsule 2   No current facility-administered medications on file prior to visit.     BP (!) 137/94 (BP Location: Left Arm, Cuff Size: Normal)   Pulse 82   Temp 98.3 F (36.8 C) (Oral)   Resp 16   Ht 5\' 8"  (1.727 m)   Wt 178 lb (80.7 kg)   SpO2 98%   BMI 27.06 kg/m    Objective:   Physical Exam  Constitutional: He is oriented to person, place, and time. He appears well-developed and well-nourished. No distress.  HENT:  Head: Normocephalic and atraumatic.  Cardiovascular: Normal rate and regular rhythm.   No murmur heard. Pulmonary/Chest: Effort normal and breath sounds normal. No respiratory distress. He has no wheezes.  He has no rales.  Musculoskeletal: He exhibits no edema.  Neurological: He is alert and oriented to person, place, and time.  Skin: Skin is warm and dry.  Psychiatric: He has a normal mood and affect. His behavior is normal. Thought content normal.          Assessment & Plan:  Hypertension-diastolic blood pressure is a little bit elevated today. However he brings from home his home readings and his diastolic blood pressure at home ranges from 65-80. Will continue  atenolol at current dosing.  BPH-well controlled on Flomax continue same.  hemochromatosis-continues phlebotomy her hematology.  hyperkalemia-he saw hematology this morning and his potassium was noted to be mildly elevated at 5.3. I see that has trended upper limit of normal over the last 6 months. We discussed limiting his intake of high potassium foods. I've advised him to follow-up in one week for follow-up potassium level

## 2016-12-11 NOTE — Progress Notes (Signed)
Hematology and Oncology Follow Up Visit  Brandon Hensley [id number] [birth year] 83 y.o. 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 He is feeling much better. He does not have the throat pain that he had before. When I last saw him, I was worried about the possibility of recurrence of his head and neck cancer. We did do a CT scan of his face and neck. Everything looked okay. He does have terrible dental disease.   There's been no problems with eating. He is able to swallow better.  He does have a pacemaker in place. This has been working without difficulty.  He's had no leg swelling. He's had no rashes. He's had no headache.  As far as hemochromatosis is concerned, this also has not been an issue. We saw him in late June, his ferritin was only 31 with an iron saturation 13%.   Overall, his performance status is ECOG 1.  Medications:  Current Outpatient Prescriptions:  .  aspirin 81 MG chewable tablet, Chew 81 mg by mouth daily., Disp: , Rfl:  .  atenolol (TENORMIN) 50 MG tablet, Take 1.5 tablets (75 mg total) by mouth 2 (two) times daily., Disp: 60 tablet, Rfl: 3 .  tamsulosin (FLOMAX) 0.4 MG CAPS capsule, Take 1 capsule (0.4 mg total) by mouth daily., Disp: 30 capsule, Rfl: 2  Allergies:  Allergies  Allergen Reactions  . Levothyroxine Other (See Comments)    "cannot take this medication and I don't remember what side effect I had"    Past Medical History, Surgical history, Social history, and Family History were reviewed and updated.  Review of Systems: As above  Physical Exam:  weight is 178 lb (80.7 kg). His oral temperature is 98.3 F (36.8 C). His blood pressure is 146/85 (abnormal) and his pulse is 93. His  respiration is 16 and oxygen saturation is 94%.   Wt Readings from Last 3 Encounters:  12/11/16 178 lb (80.7 kg)  10/29/16 179 lb (81.2 kg)  08/26/16 179 lb 3.2 oz (81.3 kg)     Well-developed and well-nourished white gentleman. Head and neck exam shows no ocular or oral lesions.However, there does appear to be some slight swelling and thickness of the left side of his tongue. There may be some slight fullness on the left side of his face at the angle of the jaw. There are no palpable cervical or supraclavicular lymph nodes, although there is some slight fullness on the left side of his neck. Lungs are clear. Cardiac exam regular rate and rhythm with no murmurs, rubs or bruits. Abdomen is soft. He has good bowel sounds. There is no fluid wave. There is no palpable liver or spleen tip. Back exam shows no tenderness over the spine, ribs or hips. Externally shows no clubbing, cyanosis or edema. Skin exam shows no rashes, ecchymoses or petechia. Neurological exam shows no focal neurological deficits.  Lab Results  Component Value Date   WBC 10.2 (H) 12/11/2016   HGB 14.4 12/11/2016   HCT 44.1 12/11/2016   MCV 93 12/11/2016   PLT 179 12/11/2016     Chemistry      Component Value Date/Time   NA 143 12/11/2016 0815   NA 138 03/18/2016 0804   K 5.3 (  H) 12/11/2016 0815   K 4.6 03/18/2016 0804   CL 101 12/11/2016 0815   CL 102 10/03/2012 0925   CO2 33 12/11/2016 0815   CO2 25 03/18/2016 0804   BUN 32 (H) 12/11/2016 0815   BUN 20.9 03/18/2016 0804   CREATININE 0.9 12/11/2016 0815   CREATININE 1.6 (H) 03/18/2016 0804      Component Value Date/Time   CALCIUM 9.8 12/11/2016 0815   CALCIUM 9.4 03/18/2016 0804   ALKPHOS 102 (H) 12/11/2016 0815   ALKPHOS 99 03/18/2016 0804   AST 26 12/11/2016 0815   AST 26 03/18/2016 0804   ALT 19 12/11/2016 0815   ALT 16 03/18/2016 0804   BILITOT 0.80 12/11/2016 0815   BILITOT 0.67 03/18/2016 0804         Impression and Plan: Brandon Hensley is  83 year old gentleman. He actually has hemochromatosis.This really has not been a problem for him. I'm sure that when he had this emergency surgery for the aortic dissection back in December, surgery and blood loss probably took care of a any excess iron.  Thankfully, we have not found any issues with recurrent malignancy.  I think we get him back in 6 months. I don't see any problems with his hemochromatosis.   Volanda Napoleon, MD 8/10/20188:59 AM

## 2016-12-14 ENCOUNTER — Encounter: Payer: Self-pay | Admitting: *Deleted

## 2016-12-18 ENCOUNTER — Telehealth: Payer: Self-pay | Admitting: Family

## 2016-12-18 ENCOUNTER — Other Ambulatory Visit (INDEPENDENT_AMBULATORY_CARE_PROVIDER_SITE_OTHER): Payer: Medicare Other

## 2016-12-18 DIAGNOSIS — N289 Disorder of kidney and ureter, unspecified: Secondary | ICD-10-CM

## 2016-12-18 DIAGNOSIS — E875 Hyperkalemia: Secondary | ICD-10-CM

## 2016-12-18 LAB — BASIC METABOLIC PANEL
BUN: 32 mg/dL — AB (ref 6–23)
CHLORIDE: 101 meq/L (ref 96–112)
CO2: 31 meq/L (ref 19–32)
Calcium: 9.4 mg/dL (ref 8.4–10.5)
Creatinine, Ser: 1.66 mg/dL — ABNORMAL HIGH (ref 0.40–1.50)
GFR: 41.76 mL/min — ABNORMAL LOW (ref >60.00)
GLUCOSE: 90 mg/dL (ref 70–99)
POTASSIUM: 4.7 meq/L (ref 3.5–5.1)
Sodium: 138 mEq/L (ref 135–145)

## 2016-12-18 NOTE — Telephone Encounter (Signed)
Please let pt know that his potassium is better but kidney function was worsened.  Is he taking any NSAIDS? If so needs to d/c nsaids.  Also, is he having any trouble emptying his bladder?  Be sure to take flomax.  Make sure to drink plenty of water and lets have him repeat his bmet in 1 week, dx renal insufficiency.

## 2016-12-23 NOTE — Telephone Encounter (Signed)
Notified pt and he scheduled lab appt for 12/30/16 at 8:30am. Future order entered.

## 2016-12-30 ENCOUNTER — Telehealth: Payer: Self-pay | Admitting: Family

## 2016-12-30 ENCOUNTER — Other Ambulatory Visit (INDEPENDENT_AMBULATORY_CARE_PROVIDER_SITE_OTHER): Payer: Medicare Other

## 2016-12-30 DIAGNOSIS — N289 Disorder of kidney and ureter, unspecified: Secondary | ICD-10-CM

## 2016-12-30 LAB — BASIC METABOLIC PANEL
BUN: 31 mg/dL — AB (ref 6–23)
CO2: 32 mEq/L (ref 19–32)
CREATININE: 1.64 mg/dL — AB (ref 0.40–1.50)
Calcium: 9.8 mg/dL (ref 8.4–10.5)
Chloride: 98 mEq/L (ref 96–112)
GFR: 42.34 mL/min — AB (ref >60.00)
Glucose, Bld: 93 mg/dL (ref 70–99)
Potassium: 4.4 mEq/L (ref 3.5–5.1)
Sodium: 137 mEq/L (ref 135–145)

## 2016-12-30 NOTE — Telephone Encounter (Signed)
That is right. Tks.

## 2016-12-30 NOTE — Telephone Encounter (Signed)
Kidney function remains abnormal and is unchanged from last visit. I would like for him to complete an ultrasound of his kidneys so that we can make sure everything looks normal.

## 2016-12-30 NOTE — Telephone Encounter (Signed)
Notified pt and he is agreeable to proceed with u/s but states that he is not having any problems with his kidneys functioning. I explained that his kidney functions on his lab work shows some impairment. Please advise if there is anything else you would like me to tell pt? Pt was transferred to radiology to schedule appt.

## 2017-01-06 ENCOUNTER — Ambulatory Visit (HOSPITAL_BASED_OUTPATIENT_CLINIC_OR_DEPARTMENT_OTHER)
Admission: RE | Admit: 2017-01-06 | Discharge: 2017-01-06 | Disposition: A | Discharge status: Home / Self Care | Payer: Medicare Other | Source: Ambulatory Visit | Attending: Family | Admitting: Family

## 2017-01-06 DIAGNOSIS — N289 Disorder of kidney and ureter, unspecified: Secondary | ICD-10-CM | POA: Diagnosis present

## 2017-01-29 ENCOUNTER — Telehealth: Payer: Self-pay | Admitting: *Deleted

## 2017-01-29 NOTE — Telephone Encounter (Signed)
Received Provider Query from [facility] for Medical Record Clarification on Depression for Coding purposes, OV note attached; forwarded to provider/SLS 09/28

## 2017-02-04 ENCOUNTER — Other Ambulatory Visit: Payer: Self-pay | Admitting: Family

## 2017-02-05 MED ORDER — ATENOLOL 50 MG PO TABS: 75.0000 mg | ORAL_TABLET | Freq: Two times a day (BID) | ORAL | 1 refills | Status: AC

## 2017-02-08 ENCOUNTER — Ambulatory Visit (INDEPENDENT_AMBULATORY_CARE_PROVIDER_SITE_OTHER): Payer: Medicare Other | Admitting: *Deleted

## 2017-02-08 DIAGNOSIS — I442 Atrioventricular block, complete: Secondary | ICD-10-CM | POA: Diagnosis not present

## 2017-02-09 LAB — CUP PACEART REMOTE DEVICE CHECK
Battery Remaining Longevity: 79 mo
Battery Voltage: 3.01 V
Brady Statistic AP VS Percent: 0 %
Brady Statistic AS VS Percent: 0 %
Brady Statistic RV Percent Paced: 99.98 %
Date Time Interrogation Session: 20181008135918
Implantable Lead Implant Date: 20160531
Implantable Lead Location: 753859
Implantable Lead Location: 753860
Implantable Lead Model: 5076
Implantable Pulse Generator Implant Date: 20160531
Lead Channel Impedance Value: 475 Ohm
Lead Channel Pacing Threshold Amplitude: 1 V
Lead Channel Sensing Intrinsic Amplitude: 0.875 mV
Lead Channel Sensing Intrinsic Amplitude: 0.875 mV
Lead Channel Sensing Intrinsic Amplitude: 8.25 mV
Lead Channel Setting Sensing Sensitivity: 4 mV
MDC IDC LEAD IMPLANT DT: 20160531
MDC IDC MSMT LEADCHNL RA IMPEDANCE VALUE: 304 Ohm
MDC IDC MSMT LEADCHNL RA IMPEDANCE VALUE: 418 Ohm
MDC IDC MSMT LEADCHNL RA PACING THRESHOLD PULSEWIDTH: 0.4 ms
MDC IDC MSMT LEADCHNL RV IMPEDANCE VALUE: 418 Ohm
MDC IDC MSMT LEADCHNL RV PACING THRESHOLD AMPLITUDE: 0.625 V
MDC IDC MSMT LEADCHNL RV PACING THRESHOLD PULSEWIDTH: 0.4 ms
MDC IDC MSMT LEADCHNL RV SENSING INTR AMPL: 8.25 mV
MDC IDC SET LEADCHNL RA PACING AMPLITUDE: 2 V
MDC IDC SET LEADCHNL RV PACING AMPLITUDE: 2.5 V
MDC IDC SET LEADCHNL RV PACING PULSEWIDTH: 0.4 ms
MDC IDC STAT BRADY AP VP PERCENT: 57.53 %
MDC IDC STAT BRADY AS VP PERCENT: 42.46 %
MDC IDC STAT BRADY RA PERCENT PACED: 57.51 %

## 2017-02-09 NOTE — Progress Notes (Signed)
Remote pacemaker transmission.   

## 2017-02-12 ENCOUNTER — Encounter: Payer: Self-pay | Admitting: Cardiology

## 2017-02-15 ENCOUNTER — Other Ambulatory Visit: Payer: Self-pay | Admitting: Family Medicine

## 2017-02-26 ENCOUNTER — Encounter: Payer: Self-pay | Admitting: Cardiology

## 2017-04-03 DEATH — deceased

## 2017-05-10 ENCOUNTER — Telehealth: Payer: Self-pay | Admitting: Internal Medicine

## 2017-05-10 ENCOUNTER — Telehealth: Payer: Self-pay | Admitting: Cardiology

## 2017-05-10 ENCOUNTER — Encounter: Payer: Self-pay | Admitting: *Deleted

## 2017-05-10 NOTE — Telephone Encounter (Signed)
LMOVM reminding pt to send remote transmission.   

## 2017-05-10 NOTE — Telephone Encounter (Signed)
°  Patient nephew called, patient expired 30-Mar-2017

## 2017-05-11 NOTE — Telephone Encounter (Signed)
Epic has been updated.

## 2017-05-11 NOTE — Telephone Encounter (Signed)
Marked Expired in PaceArt.  Routing to Medical Records.

## 2017-05-22 ENCOUNTER — Other Ambulatory Visit: Payer: Self-pay | Admitting: Nurse Practitioner

## 2017-06-18 ENCOUNTER — Ambulatory Visit: Payer: Self-pay | Admitting: Family

## 2017-06-18 ENCOUNTER — Other Ambulatory Visit: Payer: Self-pay
